# Patient Record
Sex: Male | Born: 1995 | Race: White | Hispanic: No | Marital: Single | Smoking: Never smoker
Health system: Southern US, Community
[De-identification: ages and names within clinical notes are randomized; demographics above are authoritative.]

---
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
## 2014-03-26 ENCOUNTER — Other Ambulatory Visit: Payer: Self-pay | Admitting: Gastroenterology

## 2014-03-26 DIAGNOSIS — IMO0001 Reserved for inherently not codable concepts without codable children: Secondary | ICD-10-CM

## 2014-03-26 DIAGNOSIS — K219 Gastro-esophageal reflux disease without esophagitis: Principal | ICD-10-CM

## 2014-03-29 ENCOUNTER — Ambulatory Visit
Admission: RE | Admit: 2014-03-29 | Discharge: 2014-03-29 | Disposition: A | Discharge status: Home / Self Care | Payer: BLUE CROSS/BLUE SHIELD | Source: Ambulatory Visit | Attending: Gastroenterology | Admitting: Gastroenterology

## 2014-03-29 DIAGNOSIS — IMO0001 Reserved for inherently not codable concepts without codable children: Secondary | ICD-10-CM

## 2014-03-29 DIAGNOSIS — K219 Gastro-esophageal reflux disease without esophagitis: Principal | ICD-10-CM

## 2014-04-23 ENCOUNTER — Other Ambulatory Visit (INDEPENDENT_AMBULATORY_CARE_PROVIDER_SITE_OTHER): Payer: Self-pay | Admitting: Surgery

## 2014-04-23 NOTE — H&P (Signed)
Gordon Coleman 04/23/2014 4:15 PM Location: [facility] Patient #: [id number] DOB: 05-Jun-1995 Single / Language: [place] / Race: White Male History of Present Illness Gordon Coleman(Gordon Masih C. Jilliann Subramanian MD; 04/23/2014 6:04 PM) Patient words: evaluate possible fistula vs fissure.  The patient is a 19 year old male who presents with an anal fissure. Patient sent by gastroenterologist Dr. Madilyn FiremanHayes for concern of anal fistula versus fissure. 19 [place] with chronic perianal pain and drainage for several years. His had recurrent flares of pain and drainage. Researched on Internet. He wondered if he had an anal fistula. Evaluated [facility] student [facility] center. Sent to gastroenterology. Concern of fissure versus fistula. Surgical consultation requested. Mother comes along as well. Patient struggles with moderate heartburn and reflux with known hiatal hernia by upper GI. Was using H2-blocker. Recently increased on proton pump inhibitor. It seems to be a little better controlled now. Over the persistent rectal drainage and bleeding has been annoying. No history of inflammatory bowel disease. No Crohn's. No ulcerative colitis. No irritable bowel syndrome. No sick contacts or travel history. No history of fall or trauma. No other infections. Other Problems Gordon Coleman(Gordon [place]; 04/23/2014 4:16 PM) Anxiety Disorder Gastroesophageal Reflux Disease  Past Surgical History Gordon Coleman(Gordon [place]; 04/23/2014 4:16 PM) No pertinent past surgical history  Diagnostic Studies History Gordon Coleman(Gordon [place], [place]; 04/23/2014 4:16 PM) Colonoscopy never  Allergies Gordon Coleman(Gordon [place], [place]; 04/23/2014 4:17 PM) No Known Drug Allergies 04/23/2014  Medication History Gordon Coleman(Gordon [place], [place]; 04/23/2014 4:18 PM) BuPROPion HCl ER (SR) (150MG  Tablet ER 12HR, Oral) Active. Qvar (80MCG/ACT Aerosol Soln, Inhalation) Active. Symbicort (160-4.5MCG/ACT Aerosol, Inhalation) Active. TraMADol HCl (50MG  Tablet, Oral)  Active. Zantac (150MG  Tablet, Oral) Active.  Social History Gordon Coleman(Gordon [place]; 04/23/2014 4:16 PM) Caffeine use Carbonated beverages. No alcohol use No drug use Tobacco use Never smoker.  Family History Gordon Coleman(Gordon [place], [place]; 04/23/2014 4:16 PM) Breast Cancer Family Members In General. Heart Disease Mother. Heart disease in male family member before age 20 Heart disease in male family member before age 20 Respiratory Condition Father.     Review of Systems Gordon Coleman(Gordon [place]; 04/23/2014 4:16 PM) General Not Present- Appetite Loss, Chills, Fatigue, Fever, Night Sweats, Weight Gain and Weight Loss. Skin Not Present- Change in Wart/Mole, Dryness, Hives, Jaundice, New Lesions, Non-Healing Wounds, Rash and Ulcer. HEENT Not Present- Earache, Hearing Loss, Hoarseness, Nose Bleed, Oral Ulcers, Ringing in the Ears, Seasonal Allergies, Sinus Pain, Sore Throat, Visual Disturbances, Wears glasses/contact lenses and Yellow Eyes. Respiratory Present- Chronic Cough. Not Present- Bloody sputum, Difficulty Breathing, Snoring and Wheezing. Breast Not Present- Breast Mass, Breast Pain, Nipple Discharge and Skin Changes. Cardiovascular Not Present- Chest Pain, Difficulty Breathing Lying Down, Leg Cramps, Palpitations, Rapid Heart Rate, Shortness of Breath and Swelling of Extremities. Gastrointestinal Present- Bloody Stool and Indigestion. Not Present- Abdominal Pain, Bloating, Change in Bowel Habits, Chronic diarrhea, Constipation, Difficulty Swallowing, Excessive gas, Gets full quickly at meals, Hemorrhoids, Nausea, Rectal Pain and Vomiting. Male Genitourinary Not Present- Blood in Urine, Change in Urinary Stream, Frequency, Impotence, Nocturia, Painful Urination, Urgency and Urine Leakage.  Vitals Gordon Coleman(Gordon [place] MA; 04/23/2014 4:16 PM) 04/23/2014 4:16 PM Weight: 225.2 lb Height: 67in Body Surface Area: 2.2 m Body Mass Index: 35.27 kg/m Temp.: 97.56F(Temporal)  Pulse: 80 (Regular)  Resp.:  18 (Unlabored)  BP: 120/68 (Sitting, Left Arm, Standard)     Physical Exam Gordon Coleman(Shrihaan Porzio C. Gianpaolo Mindel MD; 04/23/2014 4:38 PM)  General Mental Status-Alert. General Appearance-Not in acute distress, Not Sickly. Orientation-Oriented X3. Hydration-Well hydrated. Voice-Normal.  Integumentary  Global Assessment Upon inspection and palpation of skin surfaces of the - Axillae: non-tender, no inflammation or ulceration, no drainage. and Distribution of scalp and body hair is normal. General Characteristics Temperature - normal warmth is noted.  Head and Neck Head-normocephalic, atraumatic with no lesions or palpable masses. Face Global Assessment - atraumatic, no absence of expression. Neck Global Assessment - no abnormal movements, no bruit auscultated on the right, no bruit auscultated on the left, no decreased range of motion, non-tender. Trachea-midline. Thyroid Gland Characteristics - non-tender.  Eye Eyeball - Left-Extraocular movements intact, No Nystagmus. Eyeball - Right-Extraocular movements intact, No Nystagmus. Cornea - Left-No Hazy. Cornea - Right-No Hazy. Sclera/Conjunctiva - Left-No scleral icterus, No Discharge. Sclera/Conjunctiva - Right-No scleral icterus, No Discharge. Pupil - Left-Direct reaction to light normal. Pupil - Right-Direct reaction to light normal.  ENMT Ears Pinna - Left - no drainage observed, no generalized tenderness observed. Right - no drainage observed, no generalized tenderness observed. Nose and Sinuses External Inspection of the Nose - no destructive lesion observed. Inspection of the nares - Left - quiet respiration. Right - quiet respiration. Mouth and Throat Lips - Upper Lip - no fissures observed, no pallor noted. Lower Lip - no fissures observed, no pallor noted. Nasopharynx - no discharge present. Oral Cavity/Oropharynx - Tongue - no dryness observed. Oral Mucosa - no cyanosis observed. Hypopharynx - no evidence  of airway distress observed.  Chest and Lung Exam Inspection Movements - Normal and Symmetrical. Accessory muscles - No use of accessory muscles in breathing. Palpation Palpation of the chest reveals - Non-tender. Auscultation Breath sounds - Normal and Clear.  Cardiovascular Auscultation Rhythm - Regular. Murmurs & Other Heart Sounds - Auscultation of the heart reveals - No Murmurs and No Systolic Clicks.  Abdomen Inspection Inspection of the abdomen reveals - No Visible peristalsis and No Abnormal pulsations. Umbilicus - No Bleeding, No Urine drainage. Palpation/Percussion Palpation and Percussion of the abdomen reveal - Soft, Non Tender, No Rebound tenderness, No Rigidity (guarding) and No Cutaneous hyperesthesia.  Male Genitourinary Sexual Maturity Tanner 5 - Adult hair pattern and Adult penile size and shape.  Rectal Note: Perianal skin clean. Chronic posterior midline opening 4 cm from anal verge. Fibrous cord heading more proximally into the posterior anus. Mild maceration as well. No definite posterior midline fissure. Mild increased sphincter tone. Anoscopy and rectal exam held off.   Peripheral Vascular Upper Extremity Inspection - Left - No Cyanotic nailbeds, Not Ischemic. Right - No Cyanotic nailbeds, Not Ischemic.  Neurologic Neurologic evaluation reveals -normal attention span and ability to concentrate, able to name objects and repeat phrases. Appropriate fund of knowledge , normal sensation and normal coordination. Mental Status Affect - not angry, not paranoid. Cranial Nerves-Normal Bilaterally. Gait-Normal.  Neuropsychiatric Mental status exam performed with findings of-able to articulate well with normal speech/language, rate, volume and coherence, thought content normal with ability to perform basic computations and apply abstract reasoning and no evidence of hallucinations, delusions, obsessions or homicidal/suicidal  ideation.  Musculoskeletal Global Assessment Spine, Ribs and Pelvis - no instability, subluxation or laxity. Right Upper Extremity - no instability, subluxation or laxity.  Lymphatic Head & Neck  General Head & Neck Lymphatics: Bilateral - Description - No Localized lymphadenopathy. Axillary  General Axillary Region: Bilateral - Description - No Localized lymphadenopathy. Femoral & Inguinal  Generalized Femoral & Inguinal Lymphatics: Left - Description - No Localized lymphadenopathy. Right - Description - No Localized lymphadenopathy.    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 Would benefit from examination under anesthesia. Excision and fistulotomy if superficial. LIFT repair if it is deeper / intersphincteric. Doubt it will need a seton. He is wishing to plan this surgery over a time when he will miss school the least. Hopefully will not worsen by the summertime. Sooner if recurrent flares. His mother wished sooner.  Current Plans Schedule for Surgery Pt Education - [facility] Abscess/Fistula (AT) Pt Education - [facility] Rectal Surgery HCI (Doree Kuehne): discussed with patient and provided information. Pt Education - [facility] Good Bowel Health (Destin Kittler) Pt Education - [facility] Pain Control (Jendayi Berling) Pt Education - [facility] Pelvic Floor Exercises (Kegels) and Dysfunction HCI (Anaijah Augsburger)  Gordon Coleman, M.D., F.A.C.S. Gastrointestinal and Minimally Invasive Surgery [facility] [address], [address] [place], [place] [address] [phone] Main / Paging

## 2015-12-12 IMAGING — RF DG UGI W/ HIGH DENSITY W/KUB
18 of 24 series · 18 of 24 positions shown · non-contrast
Comparison: None.

CLINICAL DATA: Gastroesophageal reflux

EXAM:
UPPER GI SERIES WITH KUB
TECHNIQUE: After obtaining a scout radiograph a routine upper GI series was
performed using thin and high density barium.
FLUOROSCOPY TIME:  2 min and 36 seconds

[Series 1: run · 1 of 1 slices shown (1 of 17)]
[im 1/1]
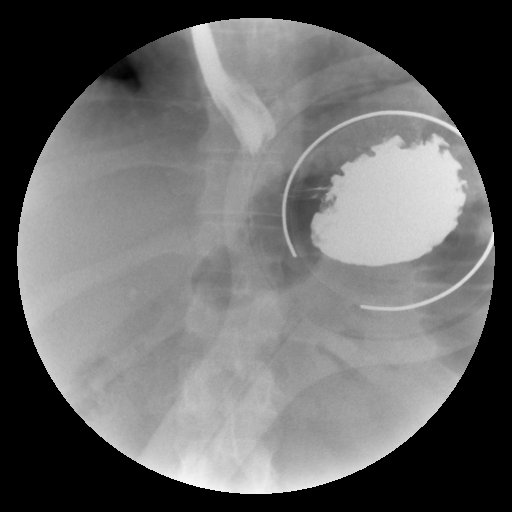

[Series 3: run · 1 of 1 slices shown (2 of 17)]
[im 1/1]
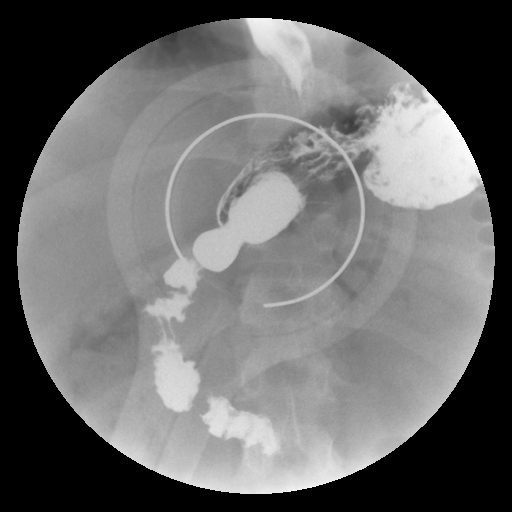

[Series 4: run · 1 of 1 slices shown (3 of 17)]
[im 1/1]
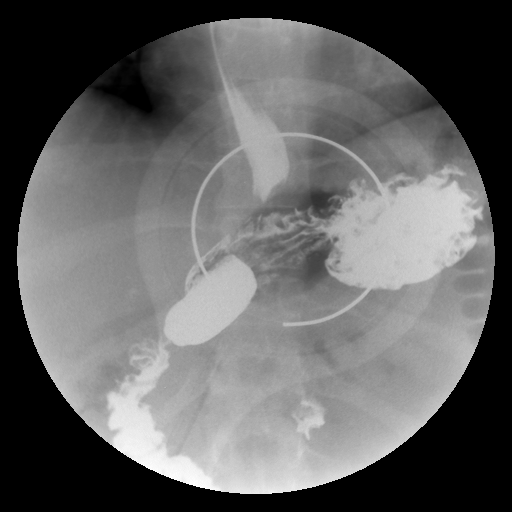

[Series 5: run · 1 of 1 slices shown (4 of 17)]
[im 1/1]
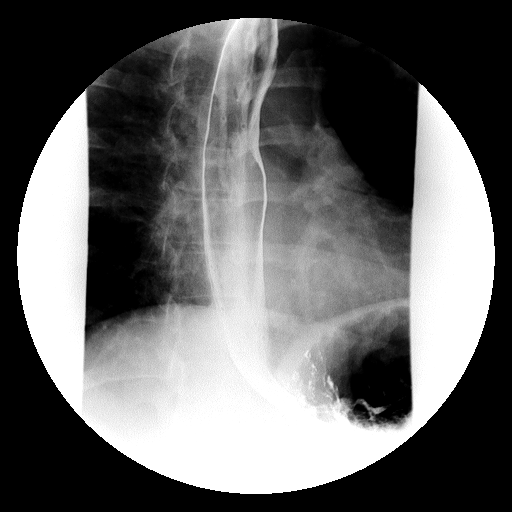

[Series 7: run · 1 of 1 slices shown (5 of 17)]
[im 1/1]
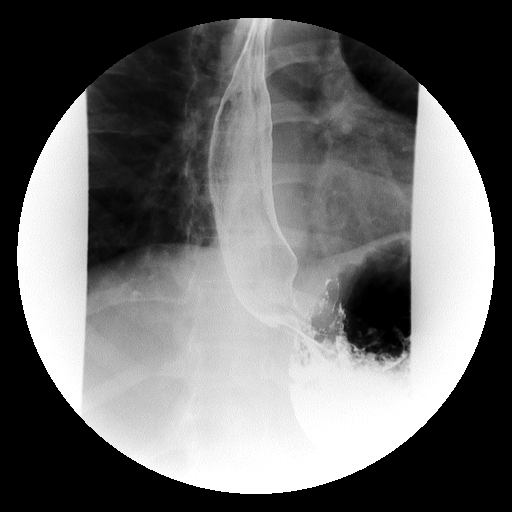

[Series 8: run · 1 of 1 slices shown (6 of 17)]
[im 1/1]
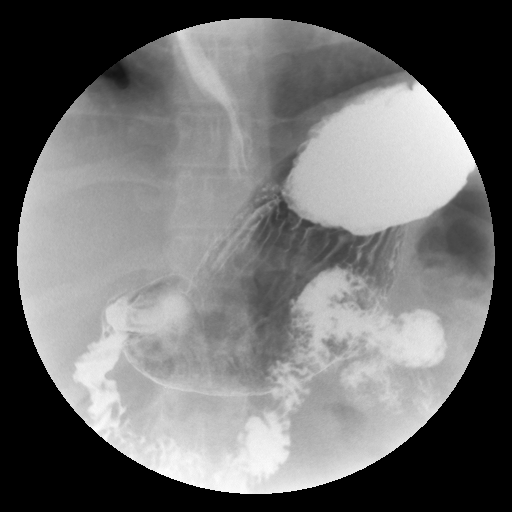

[Series 9: run · 1 of 1 slices shown (7 of 17)]
[im 1/1]
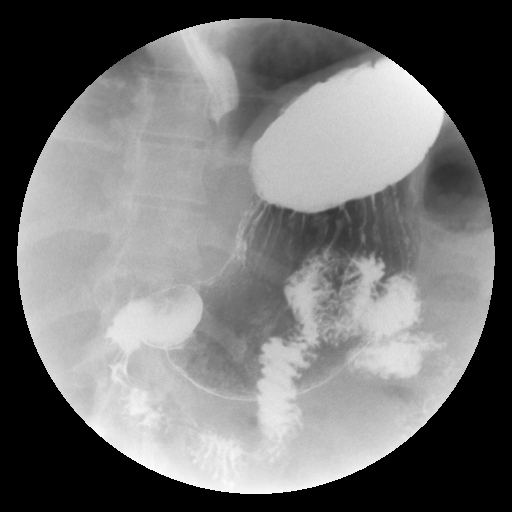

[Series 11: run · 1 of 1 slices shown (8 of 17)]
[im 1/1]
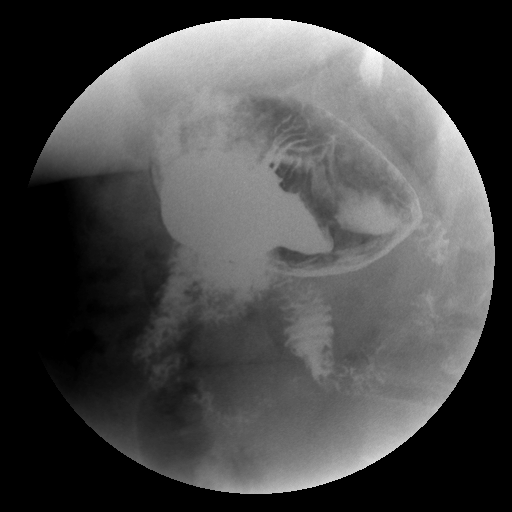

[Series 12: run · 1 of 1 slices shown (9 of 17)]
[im 1/1]
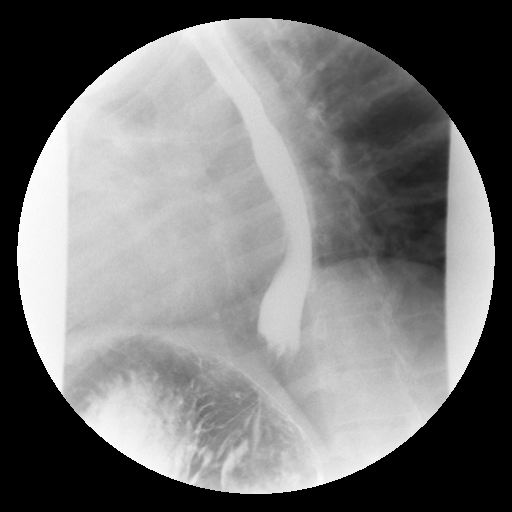

[Series 13: run · 1 of 1 slices shown (10 of 17)]
[im 1/1]
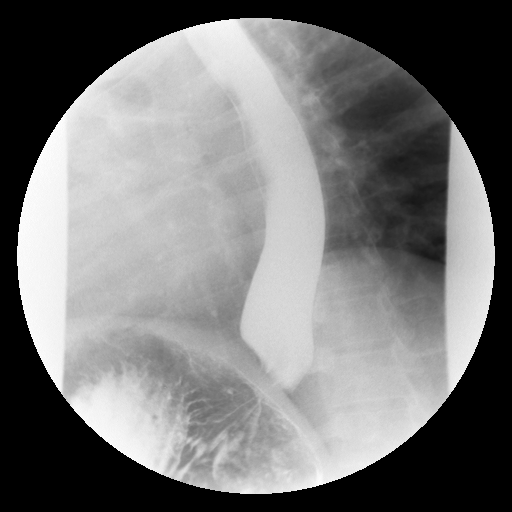

[Series 15: run · 1 of 1 slices shown (11 of 17)]
[im 1/1]
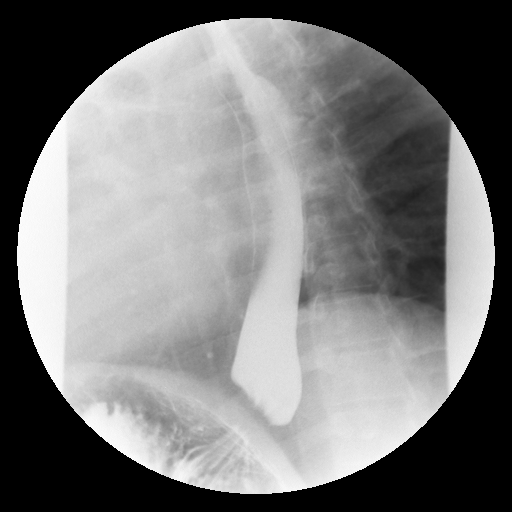

[Series 16: run · 1 of 1 slices shown (12 of 17)]
[im 1/1]
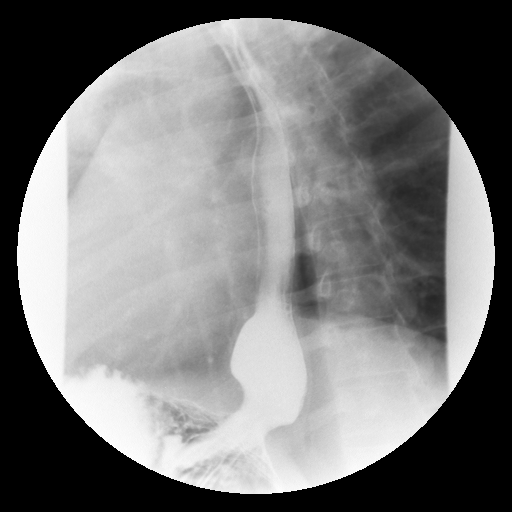

[Series 17: run · 1 of 1 slices shown (13 of 17)]
[im 1/1]
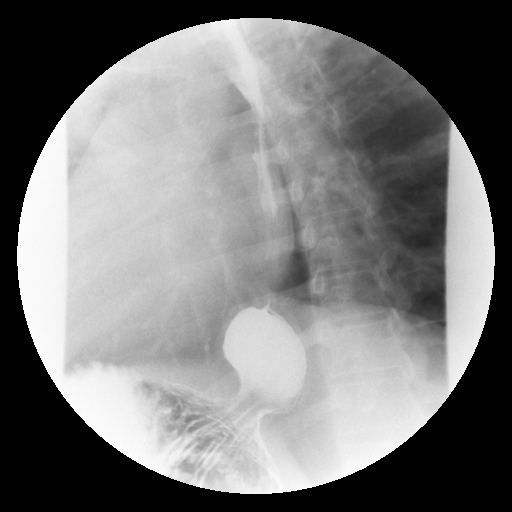

[Series 19: run · 1 of 1 slices shown (14 of 17)]
[im 1/1]
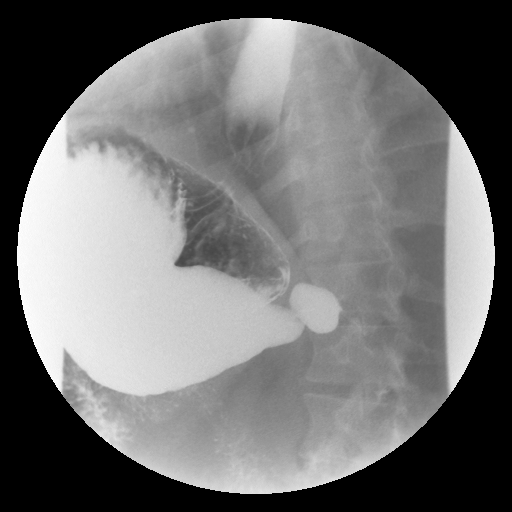

[Series 20: run · 1 of 1 slices shown (15 of 17)]
[im 1/1]
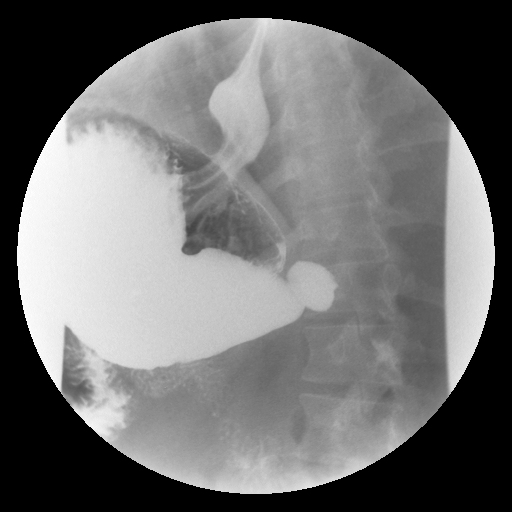

[Series 21: run · 1 of 1 slices shown (16 of 17)]
[im 1/1]
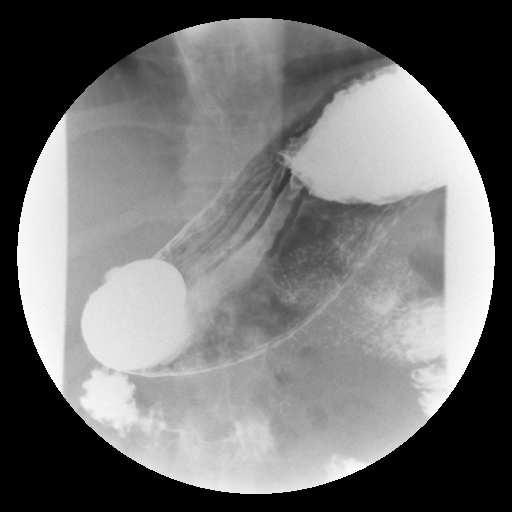

[Series 23: run · 1 of 1 slices shown (17 of 17)]
[im 1/1]
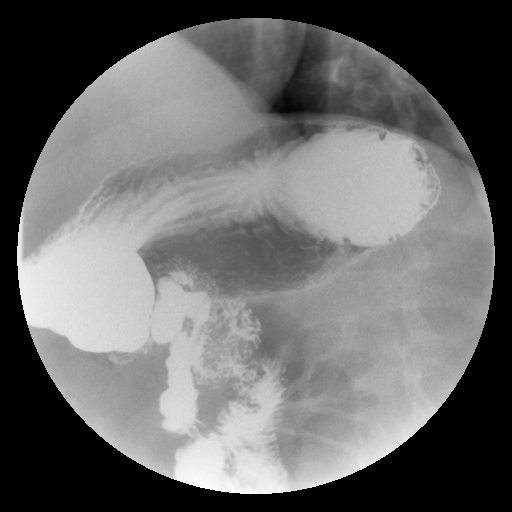

[Series 1001: view not recorded · 0.20mm/px · 1 of 1 slices shown]
[im 1/1]
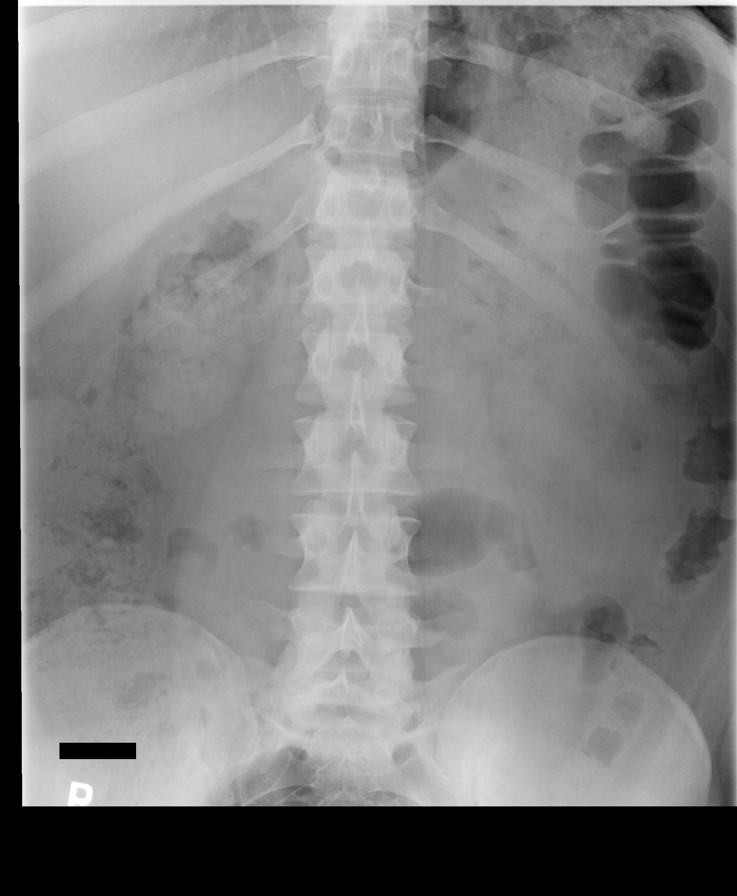

[18 of 24 positions shown; findings below may reference images not displayed]

FINDINGS: Initial barium swallows demonstrate normal esophageal motility. No
intrinsic or extrinsic lesions of the esophagus are identified and
no mucosal abnormalities are seen. There is a small sliding-type
hiatal hernia and air were multiple episodes of spontaneous GE
reflux.

The stomach, duodenum bulb and C-loop are normal. Normal mucosal
fold pattern.
IMPRESSION: Small sliding-type hiatal hernia and episodes of spontaneous GE
reflux.

Normal appearance of the stomach and duodenum.

## 2016-08-27 MED FILL — OMEPRAZOLE DR 40 MG CAPSULE: 40 | 30 days supply | Qty: 60 | Fill #0

## 2016-09-30 MED FILL — OMEPRAZOLE DR 40 MG CAPSULE: 40 | 90 days supply | Qty: 180 | Fill #1

## 2016-12-02 DIAGNOSIS — F338 Other recurrent depressive disorders: Secondary | ICD-10-CM | POA: Diagnosis not present

## 2016-12-02 DIAGNOSIS — R41840 Attention and concentration deficit: Secondary | ICD-10-CM | POA: Diagnosis not present

## 2016-12-02 DIAGNOSIS — F4010 Social phobia, unspecified: Secondary | ICD-10-CM | POA: Diagnosis not present

## 2016-12-02 DIAGNOSIS — F419 Anxiety disorder, unspecified: Secondary | ICD-10-CM | POA: Diagnosis not present

## 2016-12-02 MED FILL — ADDERALL XR 20 MG CAP SA: 20 | 30 days supply | Qty: 30 | Fill #0

## 2017-01-05 DIAGNOSIS — R41840 Attention and concentration deficit: Secondary | ICD-10-CM | POA: Diagnosis not present

## 2017-01-05 DIAGNOSIS — F338 Other recurrent depressive disorders: Secondary | ICD-10-CM | POA: Diagnosis not present

## 2017-01-05 DIAGNOSIS — F902 Attention-deficit hyperactivity disorder, combined type: Secondary | ICD-10-CM | POA: Diagnosis not present

## 2017-01-05 DIAGNOSIS — F4010 Social phobia, unspecified: Secondary | ICD-10-CM | POA: Diagnosis not present

## 2017-01-05 DIAGNOSIS — Z79899 Other long term (current) drug therapy: Secondary | ICD-10-CM | POA: Diagnosis not present

## 2017-01-05 DIAGNOSIS — F419 Anxiety disorder, unspecified: Secondary | ICD-10-CM | POA: Diagnosis not present

## 2017-01-11 MED FILL — OMEPRAZOLE DR 40 MG CAPSULE: 40 | 30 days supply | Qty: 60 | Fill #0

## 2017-02-21 MED FILL — OMEPRAZOLE DR 40 MG CAPSULE: 40 | 30 days supply | Qty: 60 | Fill #1

## 2017-03-29 MED FILL — OMEPRAZOLE DR 40 MG CAPSULE: 40 | 30 days supply | Qty: 60 | Fill #2

## 2017-03-29 MED FILL — BUPROPION HCL XL 300 MG TAB: 300 | 30 days supply | Qty: 30 | Fill #0

## 2017-04-04 DIAGNOSIS — F902 Attention-deficit hyperactivity disorder, combined type: Secondary | ICD-10-CM | POA: Diagnosis not present

## 2017-04-04 DIAGNOSIS — Z79899 Other long term (current) drug therapy: Secondary | ICD-10-CM | POA: Diagnosis not present

## 2017-04-04 DIAGNOSIS — F419 Anxiety disorder, unspecified: Secondary | ICD-10-CM | POA: Diagnosis not present

## 2017-04-04 MED FILL — guanFACINE HCL ER 1 MG TB24: 1 | 30 days supply | Qty: 30 | Fill #0

## 2017-04-09 DIAGNOSIS — F902 Attention-deficit hyperactivity disorder, combined type: Secondary | ICD-10-CM | POA: Diagnosis not present

## 2017-04-09 DIAGNOSIS — F419 Anxiety disorder, unspecified: Secondary | ICD-10-CM | POA: Diagnosis not present

## 2017-04-09 DIAGNOSIS — Z6829 Body mass index (BMI) 29.0-29.9, adult: Secondary | ICD-10-CM | POA: Diagnosis not present

## 2017-04-09 DIAGNOSIS — F329 Major depressive disorder, single episode, unspecified: Secondary | ICD-10-CM | POA: Diagnosis not present

## 2017-04-09 DIAGNOSIS — J45909 Unspecified asthma, uncomplicated: Secondary | ICD-10-CM | POA: Diagnosis not present

## 2017-04-09 DIAGNOSIS — Z79899 Other long term (current) drug therapy: Secondary | ICD-10-CM | POA: Diagnosis not present

## 2017-04-09 DIAGNOSIS — K219 Gastro-esophageal reflux disease without esophagitis: Secondary | ICD-10-CM | POA: Diagnosis not present

## 2017-04-11 MED FILL — METHYLPHENIDATE ER 50 MG CA: 50 | 30 days supply | Qty: 30 | Fill #0

## 2017-04-29 MED FILL — BUPROPION HCL XL 300 MG TAB: 300 | 30 days supply | Qty: 30 | Fill #1

## 2017-04-29 MED FILL — OMEPRAZOLE DR 40 MG CAPSULE: 40 | 30 days supply | Qty: 60 | Fill #3

## 2017-04-29 MED FILL — guanFACINE HCL ER 1 MG TB24: 1 | 30 days supply | Qty: 30 | Fill #1

## 2017-05-04 MED FILL — VYVANSE 10 MG CAPSULE: 10 | 30 days supply | Qty: 30 | Fill #0

## 2017-05-23 MED FILL — VYVANSE 60 MG CAPSULE: 60 | 30 days supply | Qty: 30 | Fill #0

## 2017-05-23 MED FILL — DEXTROAMP-AMP 10 MG TAB: 10 | 30 days supply | Qty: 30 | Fill #0

## 2017-06-08 MED FILL — BUPROPION HCL XL 300 MG TAB: 300 | 30 days supply | Qty: 30 | Fill #2

## 2017-06-09 MED FILL — guanFACINE HCL ER 1 MG TB24: 1 | 30 days supply | Qty: 30 | Fill #0

## 2017-06-22 ENCOUNTER — Telehealth: Payer: 59 | Admitting: Family

## 2017-06-22 DIAGNOSIS — J208 Acute bronchitis due to other specified organisms: Secondary | ICD-10-CM | POA: Diagnosis not present

## 2017-06-22 DIAGNOSIS — B9689 Other specified bacterial agents as the cause of diseases classified elsewhere: Secondary | ICD-10-CM

## 2017-06-22 DIAGNOSIS — R05 Cough: Secondary | ICD-10-CM

## 2017-06-22 DIAGNOSIS — R059 Cough, unspecified: Secondary | ICD-10-CM

## 2017-06-22 MED ORDER — AZITHROMYCIN 250 MG PO TABS: ORAL_TABLET | ORAL | 0 refills | Status: DC

## 2017-06-22 MED ORDER — PREDNISONE 10 MG (21) PO TBPK: ORAL_TABLET | ORAL | 0 refills | Status: DC

## 2017-06-22 NOTE — Progress Notes (Signed)
We are sorry that you are not feeling well.  Here is how we plan to help!  Based on your presentation I believe you most likely have A cough due to bacteria.  When patients have a fever and a productive cough with a change in color or increased sputum production, we are concerned about bacterial bronchitis.  If left untreated it can progress to pneumonia.  If your symptoms do not improve with your treatment plan it is important that you contact your provider.   I have prescribed Azithromyin 250 mg: two tablets now and then one tablet daily for 4 additonal days    In addition you may use A non-prescription cough medication called Robitussin DAC. Take 2 teaspoons every 8 hours or Delsym: take 2 teaspoons every 12 hours. and A non-prescription cough medication called Mucinex DM: take 2 tablets every 12 hours.  Sterapred 10 mg dosepak, I am sorry we can not prescribe controlled cough syrups through an Evisit.  From your responses in the eVisit questionnaire you describe inflammation in the upper respiratory tract which is causing a significant cough.  This is commonly called Bronchitis and has four common causes:    Allerg  Viral Infections  Acid Reflux  Bacterial Infection Allergies, viruses and acid reflux are treated by controlling symptoms or eliminating the cause. An example might be a cough caused by taking certain blood pressure medications. You stop the cough by changing the medication. Another example might be a cough caused by acid reflux. Controlling the reflux helps control the cough.  USE OF BRONCHODILATOR ("RESCUE") INHALERS: There is a risk from using your bronchodilator too frequently.  The risk is that over-reliance on a medication which only relaxes the muscles surrounding the breathing tubes can reduce the effectiveness of medications prescribed to reduce swelling and congestion of the tubes themselves.  Although you feel brief relief from the bronchodilator inhaler, your asthma  may actually be worsening with the tubes becoming more swollen and filled with mucus.  This can delay other crucial treatments, such as oral steroid medications. If you need to use a bronchodilator inhaler daily, several times per day, you should discuss this with your provider.  There are probably better treatments that could be used to keep your asthma under control.     HOME CARE . Only take medications as instructed by your medical team. . Complete the entire course of an antibiotic. . Drink plenty of fluids and get plenty of rest. . Avoid close contacts especially the very young and the elderly . Cover your mouth if you cough or cough into your sleeve. . Always remember to wash your hands . A steam or ultrasonic humidifier can help congestion.   GET HELP RIGHT AWAY IF: . You develop worsening fever. . You become short of breath . You cough up blood. . Your symptoms persist after you have completed your treatment plan MAKE SURE YOU   Understand these instructions.  Will watch your condition.  Will get help right away if you are not doing well or get worse.  Your e-visit answers were reviewed by a board certified advanced clinical practitioner to complete your personal care plan.  Depending on the condition, your plan could have included both over the counter or prescription medications. If there is a problem please reply  once you have received a response from your provider. Your safety is important to us.  If you have drug allergies check your prescription carefully.    You can use  MyChart to ask questions about today's visit, request a non-urgent call back, or ask for a work or school excuse for 24 hours related to this e-Visit. If it has been greater than 24 hours you will need to follow up with your provider, or enter a new e-Visit to address those concerns. You will get an e-mail in the next two days asking about your experience.  I hope that your e-visit has been valuable and  will speed your recovery. Thank you for using e-visits.

## 2017-06-23 MED FILL — VYVANSE 60 MG CAPSULE: 60 | 30 days supply | Qty: 30 | Fill #0

## 2017-06-24 ENCOUNTER — Telehealth: Payer: 59 | Admitting: Nurse Practitioner

## 2017-06-24 DIAGNOSIS — K219 Gastro-esophageal reflux disease without esophagitis: Secondary | ICD-10-CM | POA: Diagnosis not present

## 2017-06-24 DIAGNOSIS — Z79899 Other long term (current) drug therapy: Secondary | ICD-10-CM | POA: Diagnosis not present

## 2017-06-24 DIAGNOSIS — F902 Attention-deficit hyperactivity disorder, combined type: Secondary | ICD-10-CM | POA: Diagnosis not present

## 2017-06-24 DIAGNOSIS — F419 Anxiety disorder, unspecified: Secondary | ICD-10-CM | POA: Diagnosis not present

## 2017-06-24 MED ORDER — ESOMEPRAZOLE MAGNESIUM 40 MG PO CPDR: 40.0000 mg | DELAYED_RELEASE_CAPSULE | Freq: Every day | ORAL | 0 refills | Status: AC

## 2017-06-24 MED FILL — ATOMOXETINE HCL 25 MG CAP: 25 | 30 days supply | Qty: 60 | Fill #0

## 2017-06-24 MED FILL — buPROPion HCL ER (XL) 150 M: 150 | 30 days supply | Qty: 30 | Fill #0

## 2017-06-24 NOTE — Progress Notes (Signed)
We are sorry that you are not feeling well.  Here is how we plan to help!  Based on what you shared with me it looks like you most likely have Gastroesophageal Reflux Disease (GERD)  Gastroesophageal reflux disease (GERD) happens when acid from your stomach flows up into the esophagus.  When acid comes in contact with the esophagus, the acid causes sorenss (inflammation) in the esophagus.  Over time, GERD may create small holes (ulcers) in the lining of the esophagus.  I have prescribed nexium, a Protein Pump inhibitor, 20 mg daily until you follow up with a provider.  Your symptoms should improve in the next day or two.  You can use antacids as needed until symptoms resolve.  Call us if your heartburn worsens, you have trouble swallowing, weight loss, spitting up blood or recurrent vomiting.  Home Care:  May include lifestyle changes such as weight loss, quitting smoking and alcohol consumption  Avoid foods and drinks that make your symptoms worse, such as:  Caffeine or alcoholic drinks  Chocolate  Peppermint or mint flavorings  Garlic and onions  Spicy foods  Citrus fruits, such as oranges, lemons, or limes  Tomato-based foods such as sauce, chili, salsa and pizza  Fried and fatty foods  Avoid lying down for 3 hours prior to your bedtime or prior to taking a nap  Eat small, frequent meals instead of a large meals  Wear loose-fitting clothing.  Do not wear anything tight around your waist that causes pressure on your stomach.  Raise the head of your bed 6 to 8 inches with wood blocks to help you sleep.  Extra pillows will not help.  Seek Help Right Away If:  You have pain in your arms, neck, jaw, teeth or back  Your pain increases or changes in intensity or duration  You develop nausea, vomiting or sweating (diaphoresis)  You develop shortness of breath or you faint  Your vomit is green, yellow, black or looks like coffee grounds or blood  Your stool is red,  bloody or black  These symptoms could be signs of other problems, such as heart disease, gastric bleeding or esophageal bleeding.  Make sure you :  Understand these instructions.  Will watch your condition.  Will get help right away if you are not doing well or get worse.  Your e-visit answers were reviewed by a board certified advanced clinical practitioner to complete your personal care plan.  Depending on the condition, your plan could have included both over the counter or prescription medications.  If there is a problem please reply  once you have received a response from your provider.  Your safety is important to us.  If you have drug allergies check your prescription carefully.    You can use MyChart to ask questions about today's visit, request a non-urgent call back, or ask for a work or school excuse for 24 hours related to this e-Visit. If it has been greater than 24 hours you will need to follow up with your provider, or enter a new e-Visit to address those concerns.  You will get an e-mail in the next two days asking about your experience.  I hope that your e-visit has been valuable and will speed your recovery. Thank you for using e-visits.

## 2017-07-18 ENCOUNTER — Ambulatory Visit (INDEPENDENT_AMBULATORY_CARE_PROVIDER_SITE_OTHER): Payer: Self-pay | Admitting: Family Medicine

## 2017-07-18 DIAGNOSIS — Z111 Encounter for screening for respiratory tuberculosis: Secondary | ICD-10-CM

## 2017-07-18 NOTE — Patient Instructions (Signed)
Tuberculin Skin Test  Why am I having this test?  Tuberculosis (TB) is a bacterial infection caused by Mycobacterium tuberculosis. Most people who are exposed to these bacteria have a strong enough defense (immune) system to prevent the bacteria from causing TB and developing symptoms. Their bodies prevent the germs from being active and making them sick (latent TB infection).  However, if you have TB germs in your body and your immune system is weak, you can develop a TB infection. This can cause symptoms such as:  · Night sweats.  · Fever.  · Weakness.  · Weight loss.    A latent TB infection can also become active later in life if your immune system becomes weakened or compromised.  You may have this test if your health care provider suspects that you have TB. You may also have this test to screen for TB if you are at risk for getting the disease. Those at increased risk include:  · People who inject illegal drugs or share needles.  · People with HIV or other diseases that affect immunity.  · Health care workers.  · People who live in high-risk communities, such as homeless shelters, nursing homes, and correctional facilities.  · People who have been in contact with someone with TB.  · People from countries where TB is more common.    If you are in a high-risk group, your health care provider may wish to screen for TB more often. This can help prevent the spread of the disease. Sometimes TB screening is required when starting a new job, such as becoming a health care worker or a teacher. Colleges or universities may require it of new students.  What is being tested?  A tuberculin skin test is the main test used to check for exposure to the bacteria that can cause TB. The test checks for antibodies to the bacteria. Antibodies are proteins that your body produces to protect you from germs and other things that can make you sick.  Your health care provider will inject a solution known as PPD (purified  protein derivative) under the first layer of skin on your arm. This causes a blister-like bubble to form at the site. Your health care provider will then examine the site after a number of hours have passed to see if a reaction has occurred.  How do I prepare for this test?  There is no preparation required for this test.  What do the results mean?  Your test results will be reported as either negative or positive.  If the tuberculin skin test produces a negative result, it is likely that you do not have TB and have not been exposed to the TB bacteria.  If you or your health care provider suspects exposure, however, you may want to repeat the test a few weeks later. A blood test may also be used to check for TB. This is because you will not react to the tuberculin skin test until several weeks after exposure to TB bacteria.  If you test positive to the tuberculin skin test, it is likely that you have been exposed to TB bacteria. The test does not distinguish between an active and a latent TB infection.  A false-positive result can occur. A false-positive result for TB bacteria is incorrect because it indicates a condition or finding is present when it is not.  Talk to your health care provider to discuss your results, treatment options, and if necessary, the need for more tests.    It is your responsibility to obtain your test results. Ask the lab or department performing the test when and how you will get your results. Talk with your health care provider if you have any questions about your results.  Talk with your health care provider to discuss your results, treatment options, and if necessary, the need for more tests. Talk with your health care provider if you have any questions about your results.  This information is not intended to replace advice given to you by your health care provider. Make sure you discuss any questions you have with your health care provider.   Document Released: 12/09/2004 Document Revised: 11/02/2015 Document Reviewed: 06/25/2013  Elsevier Interactive Patient Education © 2018 [employer]

## 2017-07-19 MED FILL — ATOMOXETINE HCL 80 MG CAPS: 80 | 30 days supply | Qty: 30 | Fill #0

## 2017-07-19 NOTE — Progress Notes (Signed)
PPD placed by C[facility] [place]- see administration notes- patient advised to f/u in 48-72 hours for read.

## 2017-07-20 LAB — TB SKIN TEST
Induration: NEGATIVE mm
TB SKIN TEST: NEGATIVE

## 2017-07-23 ENCOUNTER — Telehealth: Payer: 59 | Admitting: Family

## 2017-07-23 DIAGNOSIS — R112 Nausea with vomiting, unspecified: Secondary | ICD-10-CM

## 2017-07-23 MED ORDER — ONDANSETRON 4 MG PO TBDP: 4.0000 mg | ORAL_TABLET | Freq: Three times a day (TID) | ORAL | 0 refills | Status: DC | PRN

## 2017-07-23 NOTE — Progress Notes (Signed)
Thank you for the details you included in the comment boxes. Those details are very helpful in determining the best course of treatment for you and help us to provide the best care.  We are sorry that you are not feeling well. Here is how we plan to help!  Based on what you have shared with me it looks like you have a Virus that is irritating your GI tract.  Vomiting is the forceful emptying of a portion of the stomach's content through the mouth.  Although nausea and vomiting can make you feel miserable, it's important to remember that these are not diseases, but rather symptoms of an underlying illness.  When we treat short term symptoms, we always caution that any symptoms that persist should be fully evaluated in a medical office.  I have prescribed a medication that will help alleviate your symptoms and allow you to stay hydrated:  Zofran 4 mg 1 tablet every 8 hours as needed for nausea and vomiting  HOME CARE:  Drink clear liquids.  This is very important! Dehydration (the lack of fluid) can lead to a serious complication.  Start off with 1 tablespoon every 5 minutes for 8 hours.  You may begin eating bland foods after 8 hours without vomiting.  Start with saltine crackers, white bread, rice, mashed potatoes, applesauce.  After 48 hours on a bland diet, you may resume a normal diet.  Try to go to sleep.  Sleep often empties the stomach and relieves the need to vomit.  GET HELP RIGHT AWAY IF:   Your symptoms do not improve or worsen within 2 days after treatment.  You have a fever for over 3 days.  You cannot keep down fluids after trying the medication.  MAKE SURE YOU:   Understand these instructions.  Will watch your condition.  Will get help right away if you are not doing well or get worse.   Thank you for choosing an e-visit. Your e-visit answers were reviewed by a board certified advanced clinical practitioner to complete your personal care plan. Depending upon the  condition, your plan could have included both over the counter or prescription medications. Please review your pharmacy choice. Be sure that the pharmacy you have chosen is open so that you can pick up your prescription now.  If there is a problem you may message your provider in MyChart to have the prescription routed to another pharmacy. Your safety is important to us. If you have drug allergies check your prescription carefully.  For the next 24 hours, you can use MyChart to ask questions about today's visit, request a non-urgent call back, or ask for a work or school excuse from your e-visit provider. You will get an e-mail in the next two days asking about your experience. I hope that your e-visit has been valuable and will speed your recovery.    

## 2017-07-25 MED FILL — DEXTROAMP-AMP 10 MG TAB: 10 | 30 days supply | Qty: 30 | Fill #0

## 2017-07-25 MED FILL — VYVANSE 60 MG CAPSULE: 60 | 30 days supply | Qty: 30 | Fill #0

## 2017-08-22 MED FILL — ATOMOXETINE HCL 80 MG CAPS: 80 | 30 days supply | Qty: 30 | Fill #1

## 2017-08-22 MED FILL — buPROPion HCL ER (XL) 150 M: 150 | 30 days supply | Qty: 30 | Fill #1

## 2017-08-29 MED FILL — DEXTROAMP-AMP 10 MG TAB: 10 | 30 days supply | Qty: 30 | Fill #0

## 2017-08-29 MED FILL — VYVANSE 60 MG CAPSULE: 60 | 30 days supply | Qty: 30 | Fill #0

## 2017-09-12 ENCOUNTER — Encounter: Payer: Self-pay | Admitting: Family Medicine

## 2017-09-12 ENCOUNTER — Ambulatory Visit (INDEPENDENT_AMBULATORY_CARE_PROVIDER_SITE_OTHER): Payer: Self-pay | Admitting: Family Medicine

## 2017-09-12 VITALS — BP 120/78 | HR 113 | Temp 98.1°F | Ht 65.0 in | Wt 161.0 lb

## 2017-09-12 DIAGNOSIS — Z0000 Encounter for general adult medical examination without abnormal findings: Secondary | ICD-10-CM

## 2017-09-12 LAB — POCT URINALYSIS DIPSTICK
GLUCOSE UA: NEGATIVE
KETONES UA: NEGATIVE
Leukocytes, UA: NEGATIVE
Nitrite, UA: NEGATIVE
PH UA: 6 (ref 5.0–8.0)
Protein, UA: POSITIVE — AB
Spec Grav, UA: >=1.03 — AB (ref 1.010–1.025)
UROBILINOGEN UA: 1 U/dL

## 2017-09-12 NOTE — Patient Instructions (Addendum)
Please contact [facility] Care at [facility] [phone] to establish care.   Health Maintenance, Male A healthy lifestyle and preventive care is important for your health and wellness. Ask your health care provider about what schedule of regular examinations is right for you. What should I know about weight and diet? Eat a Healthy Diet  Eat plenty of vegetables, fruits, whole grains, low-fat dairy products, and lean protein.  Do not eat a lot of foods high in solid fats, added sugars, or salt.  Maintain a Healthy Weight Regular exercise can help you achieve or maintain a healthy weight. You should:  Do at least 150 minutes of exercise each week. The exercise should increase your heart rate and make you sweat (moderate-intensity exercise).  Do strength-training exercises at least twice a week.  Watch Your Levels of Cholesterol and Blood Lipids  Have your blood tested for lipids and cholesterol every 5 years starting at 23 years of age. If you are at high risk for heart disease, you should start having your blood tested when you are 23 years old. You may need to have your cholesterol levels checked more often if: ? Your lipid or cholesterol levels are high. ? You are older than 23 years of age. ? You are at high risk for heart disease.  What should I know about cancer screening? Many types of cancers can be detected early and may often be prevented. Lung Cancer  You should be screened every year for lung cancer if: ? You are a current smoker who has smoked for at least 30 years. ? You are a former smoker who has quit within the past 15 years.  Talk to your health care provider about your screening options, when you should start screening, and how often you should be screened.  Colorectal Cancer  Routine colorectal cancer screening usually begins at 23 years of age and should be repeated every 5-10 years until you are 23 years old. You may need to be screened more often if early forms  of precancerous polyps or small growths are found. Your health care provider may recommend screening at an earlier age if you have risk factors for colon cancer.  Your health care provider may recommend using home test kits to check for hidden blood in the stool.  A small camera at the end of a tube can be used to examine your colon (sigmoidoscopy or colonoscopy). This checks for the earliest forms of colorectal cancer.  Prostate and Testicular Cancer  Depending on your age and overall health, your health care provider may do certain tests to screen for prostate and testicular cancer.  Talk to your health care provider about any symptoms or concerns you have about testicular or prostate cancer.  Skin Cancer  Check your skin from head to toe regularly.  Tell your health care provider about any new moles or changes in moles, especially if: ? There is a change in a mole's size, shape, or color. ? You have a mole that is larger than a pencil eraser.  Always use sunscreen. Apply sunscreen liberally and repeat throughout the day.  Protect yourself by wearing long sleeves, pants, a wide-brimmed hat, and sunglasses when outside.  What should I know about heart disease, diabetes, and high blood pressure?  If you are 5018-23 years of age, have your blood pressure checked every 3-5 years. If you are 10740 years of age or older, have your blood pressure checked every year. You should have your blood pressure measured  twice-once when you are at a hospital or clinic, and once when you are not at a hospital or clinic. Record the average of the two measurements. To check your blood pressure when you are not at a hospital or clinic, you can use: ? An automated blood pressure machine at a pharmacy. ? A home blood pressure monitor.  Talk to your health care provider about your target blood pressure.  If you are between 30-31 years old, ask your health care provider if you should take aspirin to prevent heart  disease.  Have regular diabetes screenings by checking your fasting blood sugar level. ? If you are at a normal weight and have a low risk for diabetes, have this test once every three years after the age of 18. ? If you are overweight and have a high risk for diabetes, consider being tested at a younger age or more often.  A one-time screening for abdominal aortic aneurysm (AAA) by ultrasound is recommended for men aged 21-75 years who are current or former smokers. What should I know about preventing infection? Hepatitis B If you have a higher risk for hepatitis B, you should be screened for this virus. Talk with your health care provider to find out if you are at risk for hepatitis B infection. Hepatitis C Blood testing is recommended for:  Everyone born from [birth year] through 1966.  Anyone with known risk factors for hepatitis C.  Sexually Transmitted Diseases (STDs)  You should be screened each year for STDs including gonorrhea and chlamydia if: ? You are sexually active and are younger than 23 years of age. ? You are older than 23 years of age and your health care provider tells you that you are at risk for this type of infection. ? Your sexual activity has changed since you were last screened and you are at an increased risk for chlamydia or gonorrhea. Ask your health care provider if you are at risk.  Talk with your health care provider about whether you are at high risk of being infected with HIV. Your health care provider may recommend a prescription medicine to help prevent HIV infection.  What else can I do?  Schedule regular health, dental, and eye exams.  Stay current with your vaccines (immunizations).  Do not use any tobacco products, such as cigarettes, chewing tobacco, and e-cigarettes. If you need help quitting, ask your health care provider.  Limit alcohol intake to no more than 2 drinks per day. One drink equals 12 ounces of beer, 5 ounces of wine, or 1 ounces of  hard liquor.  Do not use street drugs.  Do not share needles.  Ask your health care provider for help if you need support or information about quitting drugs.  Tell your health care provider if you often feel depressed.  Tell your health care provider if you have ever been abused or do not feel safe at home. This information is not intended to replace advice given to you by your health care provider. Make sure you discuss any questions you have with your health care provider. Document Released: 08/28/2007 Document Revised: 10/29/2015 Document Reviewed: 12/03/2014 Elsevier Interactive Patient Education  [employer].

## 2017-09-12 NOTE — Progress Notes (Signed)
Subjective:  Gordon Coleman is a 22 y.o. male who presents for wellness visit in order to satisfy employee sponsored health insurance benefits. No recent follow-up with a primary care provider. Patient denies any current health related concerns. Although, notes a history of uncontrolled GERD, hiatal hernia, and idiopathic cough. At present he is being treated for ADHD for a mental health provider, however, he is without a primary care provider. Immunization History  Administered Date(s) Administered  . PPD Test 07/18/2017   Family History  Problem Relation Age of Onset  . Arrhythmia Mother   . Heart attack Maternal Uncle        several suffered from heart attacks   Social History   Tobacco Use  . Smoking status: Never Smoker  . Smokeless tobacco: Never Used  Substance Use Topics  . Alcohol use: No  . Drug use: No    No Known Allergies  Current Outpatient Medications  Medication Sig Dispense Refill  . azithromycin (ZITHROMAX) 250 MG tablet Take 500 mg once, then 250 mg for four days 6 tablet 0  . beclomethasone (QVAR) 80 MCG/ACT inhaler Inhale 1 puff into the lungs as needed. 1 Inhaler 1  . budesonide-formoterol (SYMBICORT) 160-4.5 MCG/ACT inhaler Inhale 2 puffs into the lungs 2 (two) times daily. 1 Inhaler 12  . chlorpheniramine-HYDROcodone (TUSSIONEX) 10-8 MG/5ML LQCR Take 5 mLs by mouth every 12 (twelve) hours as needed. 140 mL 2  . esomeprazole (NEXIUM) 40 MG capsule Take 1 capsule (40 mg total) by mouth daily. 30 capsule 0  . ondansetron (ZOFRAN ODT) 4 MG disintegrating tablet Take 1 tablet (4 mg total) by mouth every 8 (eight) hours as needed for nausea or vomiting. 20 tablet 0  . predniSONE (STERAPRED UNI-PAK 21 TAB) 10 MG (21) TBPK tablet Use as directed 21 tablet 0  . ranitidine (ZANTAC) 150 MG capsule Take 1 capsule (150 mg total) by mouth 2 (two) times daily. 60 capsule 12  . traMADol (ULTRAM) 50 MG tablet Take 2 tablets (100 mg total) by mouth every 8 (eight) hours as  needed for pain. 30 tablet 2   No current facility-administered medications for this visit.    ROS Constitutional: Negative for fever, chills, diaphoresis, activity change, appetite change and fatigue. HENT: Negative for ear pain, nosebleeds, congestion, facial swelling, rhinorrhea, neck pain, neck stiffness and ear discharge.  Eyes: Negative for pain, discharge, redness, itching and visual disturbance. Respiratory: Negative for cough, choking, chest tightness, shortness of breath, wheezing and stridor.  Cardiovascular: Negative for chest pain, palpitations and leg swelling. Gastrointestinal: Negative for abdominal distention. Genitourinary:  decreased urine volume and retention (since taking Vyvanse) (following up with prescriber regarding side effects).  Musculoskeletal: Negative for back pain, joint swelling, arthralgia and gait problem. Neurological: Negative for dizziness, tremors, seizures, syncope, facial asymmetry, speech difficulty, weakness, light-headedness, numbness and headaches.  Hematological: Negative for adenopathy. Does not bruise/bleed easily. Psychiatric/Behavioral: Negative for hallucinations, behavioral problems, confusion, dysphoric mood, decreased concentration and agitation.  Objective:  BP 120/78   Pulse (!) 113   Temp 98.1 F (36.7 C)   Ht 5\' 5"  (1.651 m)   Wt 161 lb (73 kg)   SpO2 98%   BMI 26.79 kg/m   Physical Exam: Constitutional: Patient appears well-developed and well-nourished. No distress. HENT: Normocephalic, atraumatic, External right and left ear normal. Oropharynx is clear and moist.  Eyes: Conjunctivae and EOM are normal. PERRLA, no scleral icterus. Neck: Normal ROM. Neck supple. No JVD. No tracheal deviation. No thyromegaly. CVS: RRR, S1/S2 +,  no murmurs, no gallops, no carotid bruit.  Pulmonary: Effort and breath sounds normal, no stridor, rhonchi, wheezes, rales.  Abdominal: Soft. BS +, no distension, tenderness, rebound or guarding. (no  hernia palpated). Musculoskeletal: Normal range of motion. No edema and no tenderness.  Neuro: Alert. Normal reflexes, muscle tone coordination. No cranial nerve deficit. Skin: Skin is warm and dry. No rash noted. Not diaphoretic. No erythema. No pallor. Psychiatric: Normal mood and affect. Behavior, judgment, thought content normal. Assessment and Plan:  1. Wellness examination- Age-appropriate anticipatory guidance provided  Encouraged efforts to reduce weight include engaging in physical activity as tolerated with goal of 150 minutes per week. Follow-up with primary care to have abnormal urine evaluated.  Godfrey Pick. Tiburcio Pea, MSN, FNP-C [facility]  [address]  [place], [place] [address] [phone]

## 2017-09-29 MED FILL — AMPHETAMINE-DEXTROAMPHETAMI: 10 | 30 days supply | Qty: 30 | Fill #0

## 2017-09-29 MED FILL — VYVANSE 60 MG CAPSULE: 60 | 30 days supply | Qty: 30 | Fill #0

## 2017-09-30 MED FILL — buPROPion HCL ER (XL) 150 M: 150 | 30 days supply | Qty: 30 | Fill #2

## 2017-09-30 MED FILL — ATOMOXETINE HCL 80 MG CAPS: 80 | 30 days supply | Qty: 30 | Fill #2

## 2017-10-04 DIAGNOSIS — F902 Attention-deficit hyperactivity disorder, combined type: Secondary | ICD-10-CM | POA: Diagnosis not present

## 2017-10-04 DIAGNOSIS — F419 Anxiety disorder, unspecified: Secondary | ICD-10-CM | POA: Diagnosis not present

## 2017-10-04 DIAGNOSIS — F338 Other recurrent depressive disorders: Secondary | ICD-10-CM | POA: Diagnosis not present

## 2017-10-04 DIAGNOSIS — Z79899 Other long term (current) drug therapy: Secondary | ICD-10-CM | POA: Diagnosis not present

## 2017-10-04 MED FILL — cloNIDine HCL ER 0.1 MG TB1: 0.1 | 37 days supply | Qty: 60 | Fill #0

## 2017-10-31 MED FILL — ATOMOXETINE HCL 80 MG CAPS: 80 | 30 days supply | Qty: 30 | Fill #3

## 2017-10-31 MED FILL — buPROPion HCL ER (XL) 150 M: 150 | 30 days supply | Qty: 30 | Fill #3

## 2017-10-31 MED FILL — ADDERALL XR 30 MG CAP SA: 30 | 30 days supply | Qty: 30 | Fill #0

## 2017-11-02 MED FILL — cloNIDine HCL ER 0.1 MG TB1: 0.1 | 30 days supply | Qty: 60 | Fill #0

## 2017-11-29 MED FILL — AMPHETAMINE-DEXTROAMPHETAMI: 10 | 30 days supply | Qty: 30 | Fill #0

## 2017-11-29 MED FILL — ADDERALL XR 30 MG CAP SA: 30 | 30 days supply | Qty: 30 | Fill #0

## 2017-11-30 MED FILL — cloNIDine HCL ER 0.1 MG TB1: 0.1 | 30 days supply | Qty: 60 | Fill #1

## 2017-12-01 MED FILL — buPROPion HCL ER (XL) 150 M: 150 | 30 days supply | Qty: 30 | Fill #0

## 2018-01-06 DIAGNOSIS — F902 Attention-deficit hyperactivity disorder, combined type: Secondary | ICD-10-CM | POA: Diagnosis not present

## 2018-01-06 DIAGNOSIS — F419 Anxiety disorder, unspecified: Secondary | ICD-10-CM | POA: Diagnosis not present

## 2018-01-06 DIAGNOSIS — F338 Other recurrent depressive disorders: Secondary | ICD-10-CM | POA: Diagnosis not present

## 2018-01-06 DIAGNOSIS — Z79899 Other long term (current) drug therapy: Secondary | ICD-10-CM | POA: Diagnosis not present

## 2018-01-06 MED FILL — guanFACINE HCL ER 1 MG TB24: 1 | 30 days supply | Qty: 30 | Fill #0

## 2018-01-06 MED FILL — ADDERALL XR 20 MG CAP SA: 20 | 30 days supply | Qty: 60 | Fill #0

## 2018-01-06 MED FILL — AMPHETAMINE-DEXTROAMPHETAMI: 10 | 30 days supply | Qty: 30 | Fill #0

## 2018-02-17 MED FILL — AMPHETAMINE-DEXTROAMPHETAMI: 10 | 30 days supply | Qty: 30 | Fill #0

## 2018-02-17 MED FILL — ADDERALL XR 20 MG CAP SA: 20 | 30 days supply | Qty: 60 | Fill #0

## 2018-02-17 MED FILL — buPROPion HCL ER (XL) 150 M: 150 | 30 days supply | Qty: 30 | Fill #0

## 2018-02-17 MED FILL — guanFACINE HCL ER 2 MG TB24: 2 | 30 days supply | Qty: 30 | Fill #0

## 2018-03-17 MED FILL — guanFACINE HCL ER 2 MG TB24: 2 | 30 days supply | Qty: 30 | Fill #1

## 2018-03-24 MED FILL — ADDERALL XR 20 MG CAP SA: 20 | 30 days supply | Qty: 60 | Fill #0

## 2018-03-24 MED FILL — AMPHETAMINE-DEXTROAMPHETAMI: 10 | 30 days supply | Qty: 30 | Fill #0

## 2018-04-07 DIAGNOSIS — F419 Anxiety disorder, unspecified: Secondary | ICD-10-CM | POA: Diagnosis not present

## 2018-04-07 DIAGNOSIS — F902 Attention-deficit hyperactivity disorder, combined type: Secondary | ICD-10-CM | POA: Diagnosis not present

## 2018-04-07 DIAGNOSIS — F338 Other recurrent depressive disorders: Secondary | ICD-10-CM | POA: Diagnosis not present

## 2018-04-07 DIAGNOSIS — Z79899 Other long term (current) drug therapy: Secondary | ICD-10-CM | POA: Diagnosis not present

## 2018-05-08 MED FILL — guanFACINE HCL ER 2 MG TB24: 2 | 30 days supply | Qty: 30 | Fill #0

## 2018-05-16 MED FILL — AMPHETAMINE-DEXTROAMPHETAMI: 10 | 30 days supply | Qty: 30 | Fill #0

## 2018-05-16 MED FILL — ADDERALL XR 20 MG CAP SA: 20 | 30 days supply | Qty: 60 | Fill #0

## 2018-05-23 ENCOUNTER — Telehealth: Payer: 59 | Admitting: Physician Assistant

## 2018-05-23 DIAGNOSIS — K219 Gastro-esophageal reflux disease without esophagitis: Secondary | ICD-10-CM | POA: Diagnosis not present

## 2018-05-23 MED ORDER — FAMOTIDINE 20 MG PO TABS: ORAL_TABLET | ORAL | 0 refills | Status: AC

## 2018-05-23 MED ORDER — PANTOPRAZOLE SODIUM 40 MG PO TBEC: 40.0000 mg | DELAYED_RELEASE_TABLET | Freq: Every day | ORAL | 0 refills | Status: DC

## 2018-05-23 MED FILL — FAMOTIDINE 20 MG TABLET: 20 | 30 days supply | Qty: 60 | Fill #0

## 2018-05-23 MED FILL — PANTOPRAZOLE SOD DR 40 MG T: 40 | 30 days supply | Qty: 30 | Fill #0

## 2018-05-23 NOTE — Progress Notes (Signed)
We are sorry that you are not feeling well.  Here is how we plan to help!  Based on what you shared with me it looks like you most likely have Gastroesophageal Reflux Disease (GERD)  Gastroesophageal reflux disease (GERD) happens when acid from your stomach flows up into the esophagus.  When acid comes in contact with the esophagus, the acid causes sorenss (inflammation) in the esophagus.  Over time, GERD may create small holes (ulcers) in the lining of the esophagus.  Nexium is a very expensive proton pump inhibitor, even with a prescription. I am prescribing one months worth of pantoprazole 40 mg in the morning along with 1 months supply of famotidine 20 mg.  It is it is important to take pantoprazole first thing when you wake up and to eat something 30 minutes later.  This helps the medication work 3 times better than if you do not eat or if you take at another time during the day.  Instead of taking Nexium or another PPI in the evening, I suggest that you take 20 mg of famotidine otherwise known as Pepcid just before lunch and just before dinner.  Something else that can be really helpful this to elevate the head of your bed by taking some large books or bricks and placing them onto the bedpost at the head of the bed.  I am providing you with 30 days of both medications to give you time to find a primary care provider who can help you with this problem.  Home Care: May include lifestyle changes such as weight loss, quitting smoking and alcohol consumption Avoid foods and drinks that make your symptoms worse, such as: Caffeine or alcoholic drinks Chocolate Peppermint or mint flavorings Garlic and onions Spicy foods Citrus fruits, such as oranges, lemons, or limes Tomato-based foods such as sauce, chili, salsa and pizza Fried and fatty foods Avoid lying down for 3 hours prior to your bedtime or prior to taking a nap Eat small, frequent meals instead of a large meals Wear loose-fitting  clothing.  Do not wear anything tight around your waist that causes pressure on your stomach.  Seek Help Right Away If: You have pain in your arms, neck, jaw, teeth or back Your pain increases or changes in intensity or duration You develop nausea, vomiting or sweating (diaphoresis) You develop shortness of breath or you faint Your vomit is green, yellow, black or looks like coffee grounds or blood Your stool is red, bloody or black  These symptoms could be signs of other problems, such as heart disease, gastric bleeding or esophageal bleeding.  Make sure you : Understand these instructions. Will watch your condition. Will get help right away if you are not doing well or get worse.  Your e-visit answers were reviewed by a board certified advanced clinical practitioner to complete your personal care plan.  Depending on the condition, your plan could have included both over the counter or prescription medications.  If there is a problem please reply once you have received a response from your provider.  Your safety is important to [place].  If you have drug allergies check your prescription carefully.    You can use MyChart to ask questions about today's visit, request a non-urgent call back, or ask for a work or school excuse for 24 hours related to this e-Visit. If it has been greater than 24 hours you will need to follow up with your provider, or enter a new e-Visit to address those concerns.  You will get an e-mail in the next two days asking about your experience.  I hope that your e-visit has been valuable and will speed your recovery. Thank you for using e-visits.    ===View-only below this line===   ----- Message -----    From: Malachy Moan    Sent: 05/23/2018 10:30 AM EDT      To: E-Visit Mailing List Subject: E-Visit Submission: Heartburn  E-Visit Submission: Heartburn --------------------------------  Question: How long have you had heartburn? Answer:   A year or  more  Question: How often do you experience heartburn? Answer:   All the time  Question: How long does your heartburn last? Answer:   I have it all the time  Question: Where do you feel the heartburn? Answer:   In the middle of my chest            In my stomach  Question: Does your heartburn wake you from sleep? Answer:   Yes  Question: Does your heartburn limit your ability to do things you need to do? Answer:   Yes  Question: Does your heartburn change depending on whether you are sitting, standing, or lying down? Answer:   Yes  Question: Which of the following are you experiencing? Answer:   Stomach fluid entering the back of your throat            Fullness in the back of your throat  Question: Do you have any of the following? Answer:   Cough  Question: Do you have trouble swallowing? Answer:   No  Question: Do you feel full after eating less than usual? Answer:   No  Question: Do you have any of the following? Answer:   None of the above  Question: Which of  the following makes the heartburn worse? Answer:   Drinking alcohol            Drinks with caffeine            Eating certain foods            Lying down            Tight clothing            Stress  Question: Do you have any of the following? Answer:   None of the above  Question: Have you lost weight lately without trying? Answer:   No  Question: Have you ever been told you have the following? Answer:   Hiatal hernia  Question: Have you seen a healthcare provider for your heartburn? Answer:   I saw someone over a year ago  Question: Have you taken any medicines to relieve your heartburn in the past? Answer:   Antacids (Tums, Rolaids, Malox, other)            H2 Blockers (Pepcid, Tagament, Zantac, Axid)            PPIs (Prilosec, Nexium, Prevacid, Prontonix)  Question: Have the medicines provided relief? Answer:   Yes  Question: Have you had any of the following for heartburn? Answer:   None  of the above  Question: Please list your medication allergies that you may have ? (If 'none' , please list as 'none') Answer:   None  Question: Please list any additional comments  Answer:   Have taken PPIs for 27yrs and since 2014 take 80mg  esomeprazole (20mg  4x/day) and famotidine or tums PRN. Would it be possible to get a prescription for esomeprazole as I have had before,  due to the expense of buying such a high dose OTC? Used to see GI doc but was too costly, and no PCP currently though I'm trying to set up appointment - long wait for appointments at [facility] facility. Thanks!  A total of 5-10 minutes was spent evaluating this patients questionnaire and formulating a plan of care.

## 2018-06-01 MED FILL — guanFACINE HCL ER 2 MG TB24: 2 | 30 days supply | Qty: 30 | Fill #1

## 2018-06-25 DIAGNOSIS — K047 Periapical abscess without sinus: Secondary | ICD-10-CM | POA: Diagnosis not present

## 2018-06-27 MED FILL — AMPHETAMINE-DEXTROAMPHETAMI: 10 | 30 days supply | Qty: 30 | Fill #0

## 2018-06-27 MED FILL — ADDERALL XR 20 MG CAP SA: 20 | 30 days supply | Qty: 60 | Fill #0

## 2018-07-03 MED FILL — guanFACINE HCL ER 2 MG TB24: 2 | 30 days supply | Qty: 30 | Fill #2

## 2018-07-03 MED FILL — HYDROCODON-APAP 5-325: 5-325 | 4 days supply | Qty: 16 | Fill #0

## 2018-07-03 MED FILL — CLINDAMYCIN HCL 150 MG CAPS: 150 | 7 days supply | Qty: 28 | Fill #0

## 2018-08-08 MED FILL — guanFACINE HCL ER 2 MG TB24: 2 | 30 days supply | Qty: 30 | Fill #3

## 2018-08-22 MED FILL — ADDERALL XR 20 MG CAP SA: 20 | 30 days supply | Qty: 60 | Fill #0

## 2018-08-22 MED FILL — AMPHETAMINE-DEXTROAMPHETAMI: 10 | 30 days supply | Qty: 30 | Fill #0

## 2018-09-06 MED FILL — guanFACINE HCL ER 2 MG TB24: 2 | 30 days supply | Qty: 30 | Fill #4

## 2018-10-11 DIAGNOSIS — F338 Other recurrent depressive disorders: Secondary | ICD-10-CM | POA: Diagnosis not present

## 2018-10-11 DIAGNOSIS — F419 Anxiety disorder, unspecified: Secondary | ICD-10-CM | POA: Diagnosis not present

## 2018-10-11 DIAGNOSIS — F902 Attention-deficit hyperactivity disorder, combined type: Secondary | ICD-10-CM | POA: Diagnosis not present

## 2018-10-11 MED FILL — guanFACINE HCL ER 2 MG TB24: 2 | 30 days supply | Qty: 30 | Fill #5

## 2018-10-11 MED FILL — HYDROXYZINE PAMOATE 25 MG C: 25 | 30 days supply | Qty: 60 | Fill #0

## 2018-10-11 MED FILL — ADDERALL XR 20 MG CAP SA: 20 | 30 days supply | Qty: 60 | Fill #0

## 2018-10-11 MED FILL — FLUoxetine HCL 10 MG CAPS: 10 | 30 days supply | Qty: 30 | Fill #0

## 2018-10-12 MED FILL — AMPHETAMINE-DEXTROAMPHETAMI: 10 | 30 days supply | Qty: 30 | Fill #0

## 2018-10-17 MED FILL — HYDROCODON-APAP 5-325: 5-325 | 2 days supply | Qty: 14 | Fill #0

## 2018-10-17 MED FILL — CLINDAMYCIN HCL 150 MG CAPS: 150 | 7 days supply | Qty: 28 | Fill #0

## 2018-11-10 MED FILL — ADDERALL XR 20 MG CAP SA: 20 | 30 days supply | Qty: 60 | Fill #0

## 2018-11-10 MED FILL — FLUoxetine HCL 10 MG CAPS: 10 | 30 days supply | Qty: 30 | Fill #1

## 2018-11-10 MED FILL — HYDROXYZINE PAMOATE 25 MG C: 25 | 30 days supply | Qty: 60 | Fill #1

## 2018-11-10 MED FILL — AMPHETAMINE-DEXTROAMPHETAMI: 10 | 30 days supply | Qty: 30 | Fill #0

## 2018-11-10 MED FILL — guanFACINE HCL ER 2 MG TB24: 2 | 30 days supply | Qty: 30 | Fill #0

## 2018-12-04 DIAGNOSIS — Z20828 Contact with and (suspected) exposure to other viral communicable diseases: Secondary | ICD-10-CM | POA: Diagnosis not present

## 2018-12-11 MED FILL — guanFACINE HCL ER 2 MG TB24: 2 | 30 days supply | Qty: 30 | Fill #1

## 2018-12-11 MED FILL — FLUoxetine HCL 10 MG CAPS: 10 | 30 days supply | Qty: 30 | Fill #2

## 2018-12-11 MED FILL — HYDROXYZINE PAMOATE 25 MG C: 25 | 30 days supply | Qty: 60 | Fill #2

## 2019-01-05 MED FILL — AMPHETAMINE-DEXTROAMPHETAMI: 10 | 30 days supply | Qty: 30 | Fill #0

## 2019-01-05 MED FILL — FLUoxetine HCL 20 MG CAPS: 20 | 30 days supply | Qty: 30 | Fill #0

## 2019-01-05 MED FILL — ADDERALL XR 20 MG CAP SA: 20 | 30 days supply | Qty: 60 | Fill #0

## 2019-01-05 MED FILL — guanFACINE HCL ER 2 MG TB24: 2 | 30 days supply | Qty: 30 | Fill #2

## 2019-02-05 MED FILL — FLUoxetine HCL 20 MG CAPS: 20 | 30 days supply | Qty: 30 | Fill #1

## 2019-02-05 MED FILL — guanFACINE HCL ER 2 MG TB24: 2 | 30 days supply | Qty: 30 | Fill #3

## 2019-03-08 MED FILL — FLUoxetine HCL 20 MG CAPS: 20 | 30 days supply | Qty: 30 | Fill #2

## 2019-03-08 MED FILL — guanFACINE HCL ER 2 MG TB24: 2 | 30 days supply | Qty: 30 | Fill #4

## 2019-03-23 MED FILL — AMPHETAMINE-DEXTROAMPHETAMI: 10 | 30 days supply | Qty: 30 | Fill #0

## 2019-03-23 MED FILL — ADDERALL XR 20 MG CAP SA: 20 | 30 days supply | Qty: 60 | Fill #0

## 2019-04-10 MED FILL — guanFACINE HCL ER 2 MG TB24: 2 | 30 days supply | Qty: 30 | Fill #5

## 2019-04-11 MED FILL — FLUoxetine HCL 20 MG CAPS: 20 | 30 days supply | Qty: 30 | Fill #0

## 2019-05-11 MED FILL — FLUoxetine HCL 20 MG CAPS: 20 | 30 days supply | Qty: 30 | Fill #1

## 2019-05-11 MED FILL — guanFACINE HCL ER 2 MG TB24: 2 | 30 days supply | Qty: 30 | Fill #0

## 2019-05-15 DIAGNOSIS — F419 Anxiety disorder, unspecified: Secondary | ICD-10-CM | POA: Diagnosis not present

## 2019-05-15 DIAGNOSIS — Z79899 Other long term (current) drug therapy: Secondary | ICD-10-CM | POA: Diagnosis not present

## 2019-05-15 DIAGNOSIS — F902 Attention-deficit hyperactivity disorder, combined type: Secondary | ICD-10-CM | POA: Diagnosis not present

## 2019-05-15 DIAGNOSIS — F338 Other recurrent depressive disorders: Secondary | ICD-10-CM | POA: Diagnosis not present

## 2019-05-15 MED FILL — FLUoxetine HCL 40 MG CAPS: 40 | 30 days supply | Qty: 30 | Fill #0

## 2019-05-15 MED FILL — ADDERALL XR 20 MG CAP SA: 20 | 30 days supply | Qty: 60 | Fill #0

## 2019-05-15 MED FILL — AMPHETAMINE-DEXTROAMPHETAMI: 10 | 30 days supply | Qty: 30 | Fill #0

## 2019-06-09 MED FILL — FLUoxetine HCL 40 MG CAPS: 40 | 30 days supply | Qty: 30 | Fill #1

## 2019-06-11 MED FILL — guanFACINE HCL ER 2 MG TB24: 2 | 30 days supply | Qty: 30 | Fill #0

## 2019-06-25 MED FILL — ADDERALL XR 20 MG CAP SA: 20 | 30 days supply | Qty: 60 | Fill #0

## 2019-06-25 MED FILL — AMPHETAMINE-DEXTROAMPHETAMI: 10 | 30 days supply | Qty: 30 | Fill #0

## 2019-06-28 MED FILL — FLUoxetine HCL 10 MG CAPS: 10 | 30 days supply | Qty: 90 | Fill #0

## 2019-07-18 MED FILL — guanFACINE HCL ER 2 MG TB24: 2 | 30 days supply | Qty: 30 | Fill #1

## 2019-08-10 MED FILL — FLUoxetine HCL 10 MG CAPS: 10 | 30 days supply | Qty: 90 | Fill #1

## 2019-08-11 MED FILL — guanFACINE HCL ER 2 MG TB24: 2 | 30 days supply | Qty: 30 | Fill #2

## 2019-08-14 MED FILL — ADDERALL XR 20 MG CAP SA: 20 | 30 days supply | Qty: 60 | Fill #0

## 2019-08-14 MED FILL — AMPHETAMINE-DEXTROAMPHETAMI: 10 | 30 days supply | Qty: 30 | Fill #0

## 2019-09-21 MED FILL — guanFACINE HCL ER 2 MG TB24: 2 | 30 days supply | Qty: 30 | Fill #3

## 2019-09-21 MED FILL — FLUoxetine HCL 10 MG CAPS: 10 | 30 days supply | Qty: 90 | Fill #2

## 2019-09-24 MED FILL — DEXTROAMP-AMP 10 MG TAB: 10 | 30 days supply | Qty: 30 | Fill #0

## 2019-09-24 MED FILL — ADDERALL XR 20 MG CAP SA: 20 | 30 days supply | Qty: 60 | Fill #0

## 2019-10-01 DIAGNOSIS — Z79899 Other long term (current) drug therapy: Secondary | ICD-10-CM | POA: Diagnosis not present

## 2019-10-01 DIAGNOSIS — F338 Other recurrent depressive disorders: Secondary | ICD-10-CM | POA: Diagnosis not present

## 2019-10-01 DIAGNOSIS — F419 Anxiety disorder, unspecified: Secondary | ICD-10-CM | POA: Diagnosis not present

## 2019-10-01 DIAGNOSIS — F902 Attention-deficit hyperactivity disorder, combined type: Secondary | ICD-10-CM | POA: Diagnosis not present

## 2019-10-25 MED FILL — FLUoxetine HCL 10 MG CAPS: 10 | 30 days supply | Qty: 90 | Fill #3

## 2019-10-25 MED FILL — guanFACINE HCL ER 2 MG TB24: 2 | 30 days supply | Qty: 30 | Fill #4

## 2019-11-09 MED FILL — ADDERALL XR 20 MG CAP SA: 20 | 30 days supply | Qty: 60 | Fill #0

## 2019-11-09 MED FILL — AMPHETAMINE SALTS 10 MG: 10 | 30 days supply | Qty: 30 | Fill #0

## 2019-11-29 MED FILL — guanFACINE HCL ER 2 MG TB24: 2 | 30 days supply | Qty: 30 | Fill #5

## 2019-12-19 ENCOUNTER — Other Ambulatory Visit (HOSPITAL_COMMUNITY): Payer: Self-pay | Admitting: Internal Medicine

## 2019-12-19 MED FILL — AMPHETAMINE SALTS 10 MG: 10 | 30 days supply | Qty: 30 | Fill #0

## 2019-12-19 MED FILL — ADDERALL XR 20 MG CAP SA: 20 | 30 days supply | Qty: 60 | Fill #0

## 2019-12-31 ENCOUNTER — Other Ambulatory Visit (HOSPITAL_COMMUNITY): Payer: Self-pay | Admitting: Internal Medicine

## 2019-12-31 MED FILL — FLUoxetine HCL 10 MG CAPS: 10 | 30 days supply | Qty: 90 | Fill #0

## 2019-12-31 MED FILL — guanFACINE HCL ER 2 MG TB24: 2 | 30 days supply | Qty: 30 | Fill #0

## 2020-01-08 DIAGNOSIS — F902 Attention-deficit hyperactivity disorder, combined type: Secondary | ICD-10-CM | POA: Diagnosis not present

## 2020-01-08 DIAGNOSIS — Z79899 Other long term (current) drug therapy: Secondary | ICD-10-CM | POA: Diagnosis not present

## 2020-01-08 DIAGNOSIS — F419 Anxiety disorder, unspecified: Secondary | ICD-10-CM | POA: Diagnosis not present

## 2020-02-05 MED FILL — guanFACINE HCL ER 2 MG TB24: 2 | 30 days supply | Qty: 30 | Fill #1

## 2020-02-05 MED FILL — FLUoxetine HCL 10 MG CAPS: 10 | 30 days supply | Qty: 90 | Fill #1

## 2020-03-04 MED FILL — FLUoxetine HCL 10 MG CAPS: 10 | 30 days supply | Qty: 90 | Fill #2

## 2020-03-04 MED FILL — guanFACINE HCL ER 2 MG TB24: 2 | 30 days supply | Qty: 30 | Fill #2

## 2020-03-05 ENCOUNTER — Other Ambulatory Visit (HOSPITAL_COMMUNITY): Payer: Self-pay | Admitting: Internal Medicine

## 2020-03-05 MED FILL — AMPHETAMINE SALTS 10 MG: 10 | 30 days supply | Qty: 30 | Fill #0

## 2020-03-05 MED FILL — ADDERALL XR 20 MG CAP SA: 20 | 30 days supply | Qty: 60 | Fill #0

## 2020-03-06 DIAGNOSIS — H5213 Myopia, bilateral: Secondary | ICD-10-CM | POA: Diagnosis not present

## 2020-04-08 ENCOUNTER — Other Ambulatory Visit (HOSPITAL_COMMUNITY): Payer: Self-pay | Admitting: Internal Medicine

## 2020-04-08 DIAGNOSIS — Z79899 Other long term (current) drug therapy: Secondary | ICD-10-CM | POA: Diagnosis not present

## 2020-04-08 DIAGNOSIS — F419 Anxiety disorder, unspecified: Secondary | ICD-10-CM | POA: Diagnosis not present

## 2020-04-08 DIAGNOSIS — F902 Attention-deficit hyperactivity disorder, combined type: Secondary | ICD-10-CM | POA: Diagnosis not present

## 2020-04-08 MED FILL — AMPHETAMINE SALTS 10 MG: 10 | 30 days supply | Qty: 30 | Fill #0

## 2020-04-08 MED FILL — ADDERALL XR 20 MG CAP SA: 20 | 30 days supply | Qty: 60 | Fill #0

## 2020-04-10 MED FILL — guanFACINE HCL ER 2 MG TB24: 2 | 30 days supply | Qty: 30 | Fill #3

## 2020-04-10 MED FILL — FLUoxetine HCL 10 MG CAPS: 10 | 30 days supply | Qty: 90 | Fill #3

## 2020-05-09 MED FILL — guanFACINE HCL ER 2 MG TB24: 2 | 30 days supply | Qty: 30 | Fill #4

## 2020-05-12 ENCOUNTER — Other Ambulatory Visit (HOSPITAL_COMMUNITY): Payer: Self-pay | Admitting: Internal Medicine

## 2020-05-12 MED FILL — AMPHETAMINE SALTS 10 MG: 10 | 30 days supply | Qty: 30 | Fill #0

## 2020-05-12 MED FILL — ADDERALL XR 20 MG CAP SA: 20 | 30 days supply | Qty: 60 | Fill #0

## 2020-05-23 MED FILL — ADDERALL XR 20 MG CAP SA: 20 | 30 days supply | Qty: 60 | Fill #0

## 2020-06-12 MED FILL — guanFACINE HCL ER 2 MG TB24: 2 | 30 days supply | Qty: 30 | Fill #5

## 2020-07-11 ENCOUNTER — Other Ambulatory Visit (HOSPITAL_COMMUNITY): Payer: Self-pay

## 2020-07-11 MED ORDER — FLUOXETINE HCL 10 MG PO CAPS
20.0000 mg | ORAL_CAPSULE | Freq: Every day | ORAL | 3 refills | Status: DC
  Filled 2020-07-11: qty 60, 30d supply, fill #0
  Filled 2020-08-14: qty 60, 30d supply, fill #1
  Filled 2020-09-11: qty 60, 30d supply, fill #2
  Filled 2020-10-15: qty 60, 30d supply, fill #3

## 2020-07-11 MED ORDER — GUANFACINE HCL ER 2 MG PO TB24
2.0000 mg | ORAL_TABLET | Freq: Every day | ORAL | 5 refills | Status: DC
  Filled 2020-07-11: qty 30, 30d supply, fill #0
  Filled 2020-08-14: qty 30, 30d supply, fill #1
  Filled 2020-09-11: qty 30, 30d supply, fill #2
  Filled 2020-10-15: qty 30, 30d supply, fill #3
  Filled 2020-11-13: qty 30, 30d supply, fill #4
  Filled 2020-12-14: qty 30, 30d supply, fill #5

## 2020-07-11 MED ORDER — AMPHETAMINE-DEXTROAMPHET ER 20 MG PO CP24
40.0000 mg | ORAL_CAPSULE | Freq: Every day | ORAL | 0 refills | Status: DC
  Filled 2020-07-11: qty 60, 30d supply, fill #0

## 2020-07-12 ENCOUNTER — Other Ambulatory Visit (HOSPITAL_COMMUNITY): Payer: Self-pay

## 2020-07-23 ENCOUNTER — Other Ambulatory Visit (HOSPITAL_COMMUNITY): Payer: Self-pay

## 2020-07-23 DIAGNOSIS — F338 Other recurrent depressive disorders: Secondary | ICD-10-CM | POA: Diagnosis not present

## 2020-07-23 DIAGNOSIS — Z79899 Other long term (current) drug therapy: Secondary | ICD-10-CM | POA: Diagnosis not present

## 2020-07-23 DIAGNOSIS — F902 Attention-deficit hyperactivity disorder, combined type: Secondary | ICD-10-CM | POA: Diagnosis not present

## 2020-07-23 DIAGNOSIS — F419 Anxiety disorder, unspecified: Secondary | ICD-10-CM | POA: Diagnosis not present

## 2020-07-23 MED ORDER — AMPHETAMINE-DEXTROAMPHETAMINE 10 MG PO TABS
ORAL_TABLET | ORAL | 0 refills | Status: DC
  Filled 2020-07-23: qty 30, 30d supply, fill #0

## 2020-08-13 ENCOUNTER — Other Ambulatory Visit (HOSPITAL_COMMUNITY): Payer: Self-pay

## 2020-08-13 MED ORDER — AMPHETAMINE-DEXTROAMPHET ER 20 MG PO CP24
ORAL_CAPSULE | ORAL | 0 refills | Status: DC
  Filled 2020-08-13: qty 60, 30d supply, fill #0

## 2020-08-14 ENCOUNTER — Other Ambulatory Visit (HOSPITAL_COMMUNITY): Payer: Self-pay

## 2020-09-03 ENCOUNTER — Ambulatory Visit: Payer: Self-pay | Admitting: Medical

## 2020-09-11 ENCOUNTER — Other Ambulatory Visit (HOSPITAL_COMMUNITY): Payer: Self-pay

## 2020-09-30 ENCOUNTER — Other Ambulatory Visit (HOSPITAL_COMMUNITY): Payer: Self-pay

## 2020-09-30 MED ORDER — AMPHETAMINE-DEXTROAMPHETAMINE 10 MG PO TABS
5.0000 mg | ORAL_TABLET | Freq: Every day | ORAL | 0 refills | Status: DC
  Filled 2020-09-30: qty 30, 30d supply, fill #0

## 2020-09-30 MED ORDER — AMPHETAMINE-DEXTROAMPHET ER 20 MG PO CP24
40.0000 mg | ORAL_CAPSULE | Freq: Every day | ORAL | 0 refills | Status: DC
  Filled 2020-09-30: qty 60, 30d supply, fill #0

## 2020-10-15 ENCOUNTER — Other Ambulatory Visit (HOSPITAL_COMMUNITY): Payer: Self-pay

## 2020-10-22 ENCOUNTER — Other Ambulatory Visit (HOSPITAL_COMMUNITY): Payer: Self-pay

## 2020-10-22 DIAGNOSIS — F419 Anxiety disorder, unspecified: Secondary | ICD-10-CM | POA: Diagnosis not present

## 2020-10-22 DIAGNOSIS — Z79899 Other long term (current) drug therapy: Secondary | ICD-10-CM | POA: Diagnosis not present

## 2020-10-22 DIAGNOSIS — F902 Attention-deficit hyperactivity disorder, combined type: Secondary | ICD-10-CM | POA: Diagnosis not present

## 2020-10-22 DIAGNOSIS — F338 Other recurrent depressive disorders: Secondary | ICD-10-CM | POA: Diagnosis not present

## 2020-10-22 MED ORDER — FLUOXETINE HCL 10 MG PO CAPS
30.0000 mg | ORAL_CAPSULE | Freq: Every day | ORAL | 3 refills | Status: DC
  Filled 2020-10-22: qty 90, 30d supply, fill #0
  Filled 2020-12-14: qty 90, 30d supply, fill #1
  Filled 2021-01-22: qty 90, 30d supply, fill #2
  Filled 2021-02-09: qty 90, 30d supply, fill #3

## 2020-10-23 DIAGNOSIS — F902 Attention-deficit hyperactivity disorder, combined type: Secondary | ICD-10-CM | POA: Diagnosis not present

## 2020-10-23 DIAGNOSIS — Z79899 Other long term (current) drug therapy: Secondary | ICD-10-CM | POA: Diagnosis not present

## 2020-10-30 ENCOUNTER — Other Ambulatory Visit (HOSPITAL_COMMUNITY): Payer: Self-pay

## 2020-11-08 ENCOUNTER — Other Ambulatory Visit (HOSPITAL_COMMUNITY): Payer: Self-pay

## 2020-11-13 ENCOUNTER — Other Ambulatory Visit (HOSPITAL_COMMUNITY): Payer: Self-pay

## 2020-11-13 MED ORDER — AMPHETAMINE-DEXTROAMPHET ER 20 MG PO CP24
ORAL_CAPSULE | ORAL | 0 refills | Status: DC
  Filled 2020-11-13: qty 60, 30d supply, fill #0

## 2020-11-13 MED ORDER — AMPHETAMINE-DEXTROAMPHETAMINE 10 MG PO TABS
ORAL_TABLET | ORAL | 0 refills | Status: DC
  Filled 2020-11-13: qty 30, 30d supply, fill #0

## 2020-12-15 ENCOUNTER — Other Ambulatory Visit (HOSPITAL_COMMUNITY): Payer: Self-pay

## 2020-12-23 ENCOUNTER — Other Ambulatory Visit (HOSPITAL_COMMUNITY): Payer: Self-pay

## 2020-12-23 MED ORDER — AMPHETAMINE-DEXTROAMPHETAMINE 10 MG PO TABS
ORAL_TABLET | ORAL | 0 refills | Status: DC
  Filled 2020-12-23: qty 30, 30d supply, fill #0

## 2020-12-23 MED ORDER — AMPHETAMINE-DEXTROAMPHET ER 20 MG PO CP24
40.0000 mg | ORAL_CAPSULE | Freq: Every day | ORAL | 0 refills | Status: DC
  Filled 2020-12-23: qty 60, 30d supply, fill #0

## 2021-01-13 ENCOUNTER — Other Ambulatory Visit (HOSPITAL_COMMUNITY): Payer: Self-pay

## 2021-01-13 MED ORDER — GUANFACINE HCL ER 2 MG PO TB24
2.0000 mg | ORAL_TABLET | Freq: Every day | ORAL | 5 refills | Status: DC
  Filled 2021-01-13: qty 30, 30d supply, fill #0
  Filled 2021-02-09: qty 30, 30d supply, fill #1
  Filled 2021-03-02 – 2021-03-19 (×2): qty 30, 30d supply, fill #2
  Filled 2021-04-23: qty 30, 30d supply, fill #3
  Filled 2021-05-22: qty 30, 30d supply, fill #4
  Filled 2021-06-24: qty 30, 30d supply, fill #5

## 2021-01-16 ENCOUNTER — Telehealth: Payer: 59 | Admitting: Nurse Practitioner

## 2021-01-16 DIAGNOSIS — R112 Nausea with vomiting, unspecified: Secondary | ICD-10-CM | POA: Diagnosis not present

## 2021-01-16 MED ORDER — ONDANSETRON HCL 4 MG PO TABS: 4.0000 mg | ORAL_TABLET | Freq: Three times a day (TID) | ORAL | 0 refills | Status: DC | PRN

## 2021-01-16 NOTE — Progress Notes (Signed)
E-Visit for Nausea and Vomiting   We are sorry that you are not feeling well. Here is how we plan to help!  Based on what you have shared with me it looks like you have a Virus that is irritating your GI tract.  Vomiting is the forceful emptying of a portion of the stomach's content through the mouth.  Although nausea and vomiting can make you feel miserable, it's important to remember that these are not diseases, but rather symptoms of an underlying illness.  When we treat short term symptoms, we always caution that any symptoms that persist should be fully evaluated in a medical office.  I have prescribed a medication that will help alleviate your symptoms and allow you to stay hydrated:  Zofran 4 mg 1 tablet every 8 hours as needed for nausea and vomiting  HOME CARE: Drink clear liquids.  This is very important! Dehydration (the lack of fluid) can lead to a serious complication.  Start off with 1 tablespoon every 5 minutes for 8 hours. You may begin eating bland foods after 8 hours without vomiting.  Start with saltine crackers, white bread, rice, mashed potatoes, applesauce. After 48 hours on a bland diet, you may resume a normal diet. Try to go to sleep.  Sleep often empties the stomach and relieves the need to vomit.  GET HELP RIGHT AWAY IF:  Your symptoms do not improve or worsen within 2 days after treatment. You have a fever for over 3 days. You cannot keep down fluids after trying the medication.  MAKE SURE YOU:  Understand these instructions. Will watch your condition. Will get help right away if you are not doing well or get worse.    Thank you for choosing an e-visit.  Your e-visit answers were reviewed by a board certified advanced clinical practitioner to complete your personal care plan. Depending upon the condition, your plan could have included both over the counter or prescription medications.  Please review your pharmacy choice. Make sure the pharmacy is open so  you can pick up prescription now. If there is a problem, you may contact your provider through MyChart messaging and have the prescription routed to another pharmacy.  Your safety is important to us. If you have drug allergies check your prescription carefully.   For the next 24 hours you can use MyChart to ask questions about today's visit, request a non-urgent call back, or ask for a work or school excuse. You will get an email in the next two days asking about your experience. I hope that your e-visit has been valuable and will speed your recovery.  5-10 minutes spent reviewing and documenting in chart.  

## 2021-01-22 ENCOUNTER — Other Ambulatory Visit (HOSPITAL_COMMUNITY): Payer: Self-pay

## 2021-02-06 ENCOUNTER — Other Ambulatory Visit (HOSPITAL_COMMUNITY): Payer: Self-pay

## 2021-02-09 ENCOUNTER — Other Ambulatory Visit (HOSPITAL_COMMUNITY): Payer: Self-pay

## 2021-02-10 ENCOUNTER — Other Ambulatory Visit (HOSPITAL_COMMUNITY): Payer: Self-pay

## 2021-02-10 MED ORDER — AMPHETAMINE-DEXTROAMPHETAMINE 10 MG PO TABS
ORAL_TABLET | ORAL | 0 refills | Status: DC
  Filled 2021-02-10: qty 30, 30d supply, fill #0

## 2021-02-10 MED ORDER — AMPHETAMINE-DEXTROAMPHET ER 20 MG PO CP24
ORAL_CAPSULE | ORAL | 0 refills | Status: DC
  Filled 2021-02-10: qty 60, 30d supply, fill #0

## 2021-02-16 ENCOUNTER — Other Ambulatory Visit (HOSPITAL_COMMUNITY): Payer: Self-pay

## 2021-02-17 ENCOUNTER — Other Ambulatory Visit (HOSPITAL_COMMUNITY): Payer: Self-pay

## 2021-03-02 ENCOUNTER — Other Ambulatory Visit (HOSPITAL_COMMUNITY): Payer: Self-pay

## 2021-03-05 ENCOUNTER — Other Ambulatory Visit (HOSPITAL_COMMUNITY): Payer: Self-pay

## 2021-03-10 ENCOUNTER — Other Ambulatory Visit (HOSPITAL_COMMUNITY): Payer: Self-pay

## 2021-03-10 MED ORDER — FLUOXETINE HCL 10 MG PO CAPS
ORAL_CAPSULE | ORAL | 1 refills | Status: DC
  Filled 2021-03-10: qty 90, 30d supply, fill #0

## 2021-03-12 ENCOUNTER — Other Ambulatory Visit (HOSPITAL_COMMUNITY): Payer: Self-pay

## 2021-03-20 ENCOUNTER — Other Ambulatory Visit (HOSPITAL_COMMUNITY): Payer: Self-pay

## 2021-03-20 MED ORDER — AMPHETAMINE-DEXTROAMPHETAMINE 10 MG PO TABS
ORAL_TABLET | ORAL | 0 refills | Status: DC
  Filled 2021-03-20: qty 30, 30d supply, fill #0

## 2021-03-20 MED ORDER — AMPHETAMINE-DEXTROAMPHET ER 20 MG PO CP24
ORAL_CAPSULE | ORAL | 0 refills | Status: DC
  Filled 2021-03-20: qty 60, 30d supply, fill #0

## 2021-04-13 ENCOUNTER — Other Ambulatory Visit (HOSPITAL_COMMUNITY): Payer: Self-pay

## 2021-04-15 ENCOUNTER — Other Ambulatory Visit (HOSPITAL_COMMUNITY): Payer: Self-pay

## 2021-04-15 DIAGNOSIS — Z79899 Other long term (current) drug therapy: Secondary | ICD-10-CM | POA: Diagnosis not present

## 2021-04-15 DIAGNOSIS — F902 Attention-deficit hyperactivity disorder, combined type: Secondary | ICD-10-CM | POA: Diagnosis not present

## 2021-04-15 MED ORDER — AMPHETAMINE-DEXTROAMPHETAMINE 10 MG PO TABS
ORAL_TABLET | ORAL | 0 refills | Status: DC
  Filled 2021-04-23: qty 30, 30d supply, fill #0

## 2021-04-15 MED ORDER — AMPHETAMINE-DEXTROAMPHET ER 20 MG PO CP24
40.0000 mg | ORAL_CAPSULE | Freq: Every day | ORAL | 0 refills | Status: DC
  Filled 2021-04-23: qty 60, 30d supply, fill #0

## 2021-04-15 MED ORDER — FLUOXETINE HCL 10 MG PO CAPS
ORAL_CAPSULE | ORAL | 1 refills | Status: DC
  Filled 2021-04-15 – 2021-04-23 (×2): qty 120, 30d supply, fill #0
  Filled 2021-05-22: qty 120, 30d supply, fill #1

## 2021-04-15 MED ORDER — HYDROXYZINE PAMOATE 25 MG PO CAPS
ORAL_CAPSULE | ORAL | 2 refills | Status: DC
  Filled 2021-04-15 – 2021-04-23 (×2): qty 60, 30d supply, fill #0
  Filled 2021-06-24: qty 60, 30d supply, fill #1
  Filled 2021-07-24: qty 60, 30d supply, fill #2

## 2021-04-23 ENCOUNTER — Other Ambulatory Visit (HOSPITAL_COMMUNITY): Payer: Self-pay

## 2021-05-22 ENCOUNTER — Other Ambulatory Visit (HOSPITAL_COMMUNITY): Payer: Self-pay

## 2021-05-22 MED ORDER — AMPHETAMINE-DEXTROAMPHETAMINE 10 MG PO TABS
ORAL_TABLET | ORAL | 0 refills | Status: DC
  Filled 2021-05-22: qty 30, 30d supply, fill #0

## 2021-05-22 MED ORDER — AMPHETAMINE-DEXTROAMPHET ER 20 MG PO CP24
ORAL_CAPSULE | ORAL | 0 refills | Status: DC
  Filled 2021-05-22: qty 60, 30d supply, fill #0

## 2021-06-24 ENCOUNTER — Other Ambulatory Visit (HOSPITAL_COMMUNITY): Payer: Self-pay

## 2021-06-26 ENCOUNTER — Other Ambulatory Visit (HOSPITAL_COMMUNITY): Payer: Self-pay

## 2021-06-26 MED ORDER — AMPHETAMINE-DEXTROAMPHETAMINE 10 MG PO TABS
ORAL_TABLET | ORAL | 0 refills | Status: DC
  Filled 2021-06-26: qty 30, 30d supply, fill #0

## 2021-06-26 MED ORDER — AMPHETAMINE-DEXTROAMPHET ER 20 MG PO CP24
40.0000 mg | ORAL_CAPSULE | Freq: Every day | ORAL | 0 refills | Status: DC
  Filled 2021-06-26: qty 60, 30d supply, fill #0

## 2021-06-26 MED ORDER — FLUOXETINE HCL 10 MG PO CAPS
40.0000 mg | ORAL_CAPSULE | Freq: Every day | ORAL | 6 refills | Status: DC
  Filled 2021-06-26: qty 120, 30d supply, fill #0
  Filled 2021-09-28: qty 120, 30d supply, fill #1
  Filled 2021-11-13: qty 120, 30d supply, fill #2
  Filled 2021-12-14: qty 120, 30d supply, fill #3
  Filled 2022-01-15: qty 120, 30d supply, fill #4
  Filled 2022-02-19: qty 120, 30d supply, fill #5
  Filled 2022-03-24: qty 120, 30d supply, fill #6

## 2021-07-15 ENCOUNTER — Other Ambulatory Visit (HOSPITAL_COMMUNITY): Payer: Self-pay

## 2021-07-15 DIAGNOSIS — Z79899 Other long term (current) drug therapy: Secondary | ICD-10-CM | POA: Diagnosis not present

## 2021-07-15 DIAGNOSIS — F902 Attention-deficit hyperactivity disorder, combined type: Secondary | ICD-10-CM | POA: Diagnosis not present

## 2021-07-15 MED ORDER — FLUOXETINE HCL 20 MG PO CAPS
ORAL_CAPSULE | ORAL | 11 refills | Status: DC
  Filled 2021-08-06: qty 60, 30d supply, fill #0
  Filled 2021-08-28: qty 60, 30d supply, fill #1

## 2021-07-20 ENCOUNTER — Other Ambulatory Visit (HOSPITAL_COMMUNITY): Payer: Self-pay

## 2021-07-24 ENCOUNTER — Other Ambulatory Visit (HOSPITAL_COMMUNITY): Payer: Self-pay

## 2021-07-25 ENCOUNTER — Other Ambulatory Visit (HOSPITAL_COMMUNITY): Payer: Self-pay

## 2021-07-25 MED ORDER — GUANFACINE HCL ER 2 MG PO TB24
2.0000 mg | ORAL_TABLET | Freq: Every day | ORAL | 5 refills | Status: DC
  Filled 2021-07-25: qty 30, 30d supply, fill #0
  Filled 2021-08-28: qty 30, 30d supply, fill #1
  Filled 2021-09-28: qty 30, 30d supply, fill #2

## 2021-08-06 ENCOUNTER — Other Ambulatory Visit (HOSPITAL_COMMUNITY): Payer: Self-pay

## 2021-08-07 ENCOUNTER — Other Ambulatory Visit (HOSPITAL_COMMUNITY): Payer: Self-pay

## 2021-08-07 MED ORDER — AMPHETAMINE-DEXTROAMPHETAMINE 10 MG PO TABS
ORAL_TABLET | ORAL | 0 refills | Status: DC
  Filled 2021-08-07: qty 30, 30d supply, fill #0

## 2021-08-07 MED ORDER — AMPHETAMINE-DEXTROAMPHET ER 20 MG PO CP24
40.0000 mg | ORAL_CAPSULE | Freq: Every day | ORAL | 0 refills | Status: DC
  Filled 2021-08-07: qty 60, 30d supply, fill #0

## 2021-08-28 ENCOUNTER — Other Ambulatory Visit (HOSPITAL_COMMUNITY): Payer: Self-pay

## 2021-08-31 ENCOUNTER — Other Ambulatory Visit (HOSPITAL_COMMUNITY): Payer: Self-pay

## 2021-09-28 ENCOUNTER — Other Ambulatory Visit (HOSPITAL_COMMUNITY): Payer: Self-pay

## 2021-09-29 ENCOUNTER — Other Ambulatory Visit (HOSPITAL_COMMUNITY): Payer: Self-pay

## 2021-09-29 MED ORDER — AMPHETAMINE-DEXTROAMPHETAMINE 10 MG PO TABS
ORAL_TABLET | ORAL | 0 refills | Status: DC
  Filled 2021-09-29: qty 30, 30d supply, fill #0

## 2021-09-29 MED ORDER — HYDROXYZINE PAMOATE 25 MG PO CAPS
ORAL_CAPSULE | ORAL | 2 refills | Status: DC
  Filled 2021-09-29: qty 60, 30d supply, fill #0
  Filled 2021-11-13: qty 60, 30d supply, fill #1
  Filled 2021-12-14: qty 60, 30d supply, fill #2

## 2021-09-29 MED ORDER — AMPHETAMINE-DEXTROAMPHET ER 20 MG PO CP24
ORAL_CAPSULE | ORAL | 0 refills | Status: DC
  Filled 2021-09-29: qty 60, 30d supply, fill #0

## 2021-10-07 ENCOUNTER — Ambulatory Visit (HOSPITAL_BASED_OUTPATIENT_CLINIC_OR_DEPARTMENT_OTHER): Payer: 59 | Admitting: Family Medicine

## 2021-10-07 ENCOUNTER — Encounter (HOSPITAL_BASED_OUTPATIENT_CLINIC_OR_DEPARTMENT_OTHER): Payer: Self-pay

## 2021-10-21 ENCOUNTER — Other Ambulatory Visit (HOSPITAL_COMMUNITY): Payer: Self-pay

## 2021-10-21 DIAGNOSIS — F902 Attention-deficit hyperactivity disorder, combined type: Secondary | ICD-10-CM | POA: Diagnosis not present

## 2021-10-21 DIAGNOSIS — Z79899 Other long term (current) drug therapy: Secondary | ICD-10-CM | POA: Diagnosis not present

## 2021-10-21 MED ORDER — CLONIDINE HCL 0.1 MG PO TABS
ORAL_TABLET | ORAL | 2 refills | Status: AC
  Filled 2021-10-21: qty 60, 30d supply, fill #0
  Filled 2021-11-13: qty 60, 30d supply, fill #1
  Filled 2021-12-14: qty 60, 30d supply, fill #2

## 2021-10-21 MED ORDER — AMPHETAMINE-DEXTROAMPHET ER 20 MG PO CP24
ORAL_CAPSULE | ORAL | 0 refills | Status: DC
  Filled 2021-11-13: qty 60, 30d supply, fill #0

## 2021-10-21 MED ORDER — AMPHETAMINE-DEXTROAMPHETAMINE 10 MG PO TABS
ORAL_TABLET | ORAL | 0 refills | Status: AC
  Filled 2021-11-13: qty 30, 30d supply, fill #0

## 2021-11-13 ENCOUNTER — Other Ambulatory Visit (HOSPITAL_COMMUNITY): Payer: Self-pay

## 2021-11-19 ENCOUNTER — Other Ambulatory Visit (HOSPITAL_COMMUNITY): Payer: Self-pay

## 2021-11-25 ENCOUNTER — Ambulatory Visit (HOSPITAL_BASED_OUTPATIENT_CLINIC_OR_DEPARTMENT_OTHER): Payer: 59 | Admitting: Family Medicine

## 2021-11-25 ENCOUNTER — Encounter (HOSPITAL_BASED_OUTPATIENT_CLINIC_OR_DEPARTMENT_OTHER): Payer: Self-pay | Admitting: Family Medicine

## 2021-11-25 DIAGNOSIS — Z0000 Encounter for general adult medical examination without abnormal findings: Secondary | ICD-10-CM

## 2021-11-25 DIAGNOSIS — K219 Gastro-esophageal reflux disease without esophagitis: Secondary | ICD-10-CM

## 2021-11-25 DIAGNOSIS — F909 Attention-deficit hyperactivity disorder, unspecified type: Secondary | ICD-10-CM | POA: Diagnosis not present

## 2021-11-25 NOTE — Patient Instructions (Signed)
  Medication Instructions:  Your physician recommends that you continue on your current medications as directed. Please refer to the Current Medication list given to you today. --If you need a refill on any your medications before your next appointment, please call your pharmacy first. If no refills are authorized on file call the office.-- Lab Work: Your physician has recommended that you have lab work today: No If you have labs (blood work) drawn today and your tests are completely normal, you will receive your results via MyChart message OR a phone call from our staff.  Please ensure you check your voicemail in the event that you authorized detailed messages to be left on a delegated number. If you have any lab test that is abnormal or we need to change your treatment, we will call you to review the results.  Referrals/Procedures/Imaging: No  Follow-Up: Your next appointment:   Your physician recommends that you schedule a follow-up appointment in: 1-2 months cpe with Dr. de [place]. Nurse visit 1 week before for labs.   You will receive a text message or e-mail with a link to a survey about your care and experience with us today! We would greatly appreciate your feedback!   Thanks for letting us be apart of your health journey!!  Primary Care and Sports Medicine   Dr. Raymond de Cuba   We encourage you to activate your patient portal called "MyChart".  Sign up information is provided on this After Visit Summary.  MyChart is used to connect with patients for Virtual Visits (Telemedicine).  Patients are able to view lab/test results, encounter notes, upcoming appointments, etc.  Non-urgent messages can be sent to your provider as well. To learn more about what you can do with MyChart, please visit --  [email or web address].    

## 2021-11-25 NOTE — Assessment & Plan Note (Signed)
Recommend continue to follow with specialist, continue with current medication regimen.  He thinks that they had wanted certain labs to be completed, he will check with their office and let us know which labs specifically may be requested

## 2021-11-25 NOTE — Progress Notes (Signed)
New Patient Office Visit  Subjective    Patient ID: Gordon Coleman, male    DOB: Oct 14, 1995  Age: 26 y.o. MRN: [id number]  CC:  Chief Complaint  Patient presents with   New Patient (Initial Visit)    Pt here to establish new care     HPI Gordon Coleman presents to establish care Last PCP - provider [facility], has been awhile since last visit  ADHD: Currently following with provider at [facility]. They recommended that he establish with PCP for monitoring of labs, completing health maintenance recommendations.  GERD: Patient currently utilizing Nexium as well as Pepcid, we will use Pepcid intermittently.  Has had prior GI evaluation, however has been a few years since this.  Recalls being diagnosed with a hiatal hernia at 1 point.  Patient is originally from [place], grew up in [place]. Patient is working for [employer] as a Psychologist, sport and exercise. Outside of work, he enjoys video games, reading.  Outpatient Encounter Medications as of 11/25/2021  Medication Sig   amphetamine-dextroamphetamine (ADDERALL XR) 20 MG 24 hr capsule Take 2 capsule by mouth daily   amphetamine-dextroamphetamine (ADDERALL) 10 MG tablet Take 1/2 to 1 tablet by mouth daily in the afternoon as needed.   cloNIDine (CATAPRES) 0.1 MG tablet Take 1 tablet by mouth twice a day as needed for anxiety/sweating   esomeprazole (NEXIUM) 40 MG capsule Take 1 capsule (40 mg total) by mouth daily.   famotidine (PEPCID) 20 MG tablet Take 1 tab just before lunch and 1 tab just before dinner.   hydrOXYzine (VISTARIL) 25 MG capsule Take 1 to 2 capsules by mouth daily   [DISCONTINUED] FLUoxetine (PROZAC) 10 MG capsule Take 3 capsules by mouth daily.   FLUoxetine (PROZAC) 10 MG capsule Take 4 capsules by mouth daily.   [DISCONTINUED] amphetamine-dextroamphetamine (ADDERALL XR) 20 MG 24 hr capsule TAKE 2 CAPSULES BY MOUTH ONCE A DAY   [DISCONTINUED] amphetamine-dextroamphetamine (ADDERALL XR) 20 MG  24 hr capsule TAKE 2 CAPSULES BY MOUTH ONCE A DAY   [DISCONTINUED] amphetamine-dextroamphetamine (ADDERALL XR) 20 MG 24 hr capsule TAKE 2 CAPSULES BY MOUTH ONCE A DAY   [DISCONTINUED] amphetamine-dextroamphetamine (ADDERALL XR) 20 MG 24 hr capsule TAKE 2 CAPSULES BY MOUTH ONCE A DAY   [DISCONTINUED] amphetamine-dextroamphetamine (ADDERALL) 10 MG tablet    [DISCONTINUED] amphetamine-dextroamphetamine (ADDERALL) 10 MG tablet TAKE 1/2 TO 1 TABLET BY MOUTH IN THE AFTERNOON AS NEEDED   [DISCONTINUED] amphetamine-dextroamphetamine (ADDERALL) 10 MG tablet TAKE 1/2 TO 1 TABLET BY MOUTH ONCE A DAY IN THE AFTERNOON AS NEEDED   [DISCONTINUED] amphetamine-dextroamphetamine (ADDERALL) 10 MG tablet TAKE 1/2-1 TABLET BY MOUTH ONCE A DAY IN THE AFTERNOON AS NEEDED   [DISCONTINUED] azithromycin (ZITHROMAX) 250 MG tablet Take 500 mg once, then 250 mg for four days (Patient not taking: Reported on 09/12/2017)   [DISCONTINUED] beclomethasone (QVAR) 80 MCG/ACT inhaler Inhale 1 puff into the lungs as needed. (Patient not taking: Reported on 09/12/2017)   [DISCONTINUED] budesonide-formoterol (SYMBICORT) 160-4.5 MCG/ACT inhaler Inhale 2 puffs into the lungs 2 (two) times daily. (Patient not taking: Reported on 09/12/2017)   [DISCONTINUED] chlorpheniramine-HYDROcodone (TUSSIONEX) 10-8 MG/5ML LQCR Take 5 mLs by mouth every 12 (twelve) hours as needed. (Patient not taking: Reported on 09/12/2017)   [DISCONTINUED] FLUoxetine (PROZAC) 10 MG capsule TAKE 3 CAPSULES BY MOUTH DAILY   [DISCONTINUED] FLUoxetine (PROZAC) 20 MG capsule Take 2 capsules by mouth once a day   [DISCONTINUED] guanFACINE (INTUNIV) 2 MG TB24 ER tablet TAKE 1 TABLET BY  MOUTH DAILY   [DISCONTINUED] guanFACINE (INTUNIV) 2 MG TB24 ER tablet Take 1 tablet (2 mg total) by mouth daily.   [DISCONTINUED] ondansetron (ZOFRAN ODT) 4 MG disintegrating tablet Take 1 tablet (4 mg total) by mouth every 8 (eight) hours as needed for nausea or vomiting. (Patient not taking: Reported  on 09/12/2017)   [DISCONTINUED] ondansetron (ZOFRAN) 4 MG tablet Take 1 tablet (4 mg total) by mouth every 8 (eight) hours as needed for nausea or vomiting.   [DISCONTINUED] pantoprazole (PROTONIX) 40 MG tablet Take 1 tablet (40 mg total) by mouth daily. Take on an empty stomach, 30 minutes before the first meal of the day.   [DISCONTINUED] predniSONE (STERAPRED UNI-PAK 21 TAB) 10 MG (21) TBPK tablet Use as directed (Patient not taking: Reported on 09/12/2017)   [DISCONTINUED] ranitidine (ZANTAC) 150 MG capsule Take 1 capsule (150 mg total) by mouth 2 (two) times daily.   [DISCONTINUED] traMADol (ULTRAM) 50 MG tablet Take 2 tablets (100 mg total) by mouth every 8 (eight) hours as needed for pain. (Patient not taking: Reported on 09/12/2017)   [DISCONTINUED] VYVANSE 60 MG capsule    No facility-administered encounter medications on file as of 11/25/2021.    History reviewed. No pertinent past medical history.  History reviewed. No pertinent surgical history.  Family History  Problem Relation Age of Onset   Arrhythmia Mother    Heart attack Maternal Uncle        several suffered from heart attacks    Social History   Socioeconomic History   Marital status: Single    Spouse name: Not on file   Number of children: Not on file   Years of education: Not on file   Highest education level: Not on file  Occupational History   Not on file  Tobacco Use   Smoking status: Never   Smokeless tobacco: Never  Substance and Sexual Activity   Alcohol use: No   Drug use: No   Sexual activity: Not on file  Other Topics Concern   Not on file  Social History Narrative   Not on file   Social Determinants of Health   Financial Resource Strain: Not on file  Food Insecurity: Not on file  Transportation Needs: Not on file  Physical Activity: Not on file  Stress: Not on file  Social Connections: Not on file  Intimate Partner Violence: Not on file    Objective    BP 129/82   Pulse (!) 108   Ht  5\' 6"  (1.676 m)   Wt 202 lb 4.8 oz (91.8 kg)   SpO2 99%   BMI 32.65 kg/m   Physical Exam  26 year old male in no acute distress Cardiovascular Sam with regular rate and rhythm, no murmur appreciated Lungs clear to auscultation bilaterally Abdomen with normal bowel sounds, soft, nontender, nondistended, no organomegaly  Assessment & Plan:   Problem List Items Addressed This Visit       Digestive   GERD (gastroesophageal reflux disease)    Given chronicity of symptoms, continued need for PPI plus intermittent H2 blocker, will refer to gastroenterology for further evaluation and recommendations regarding medication management or if they feel that any testing or procedures will be warranted to evaluate further      Relevant Orders   Ambulatory referral to Gastroenterology     Other   ADHD    Recommend continue to follow with specialist, continue with current medication regimen.  He thinks that they had wanted certain labs to be completed,  he will check with their office and let us know which labs specifically may be requested      Other Visit Diagnoses     Wellness examination       Relevant Orders   CBC with Differential/Platelet   Comprehensive metabolic panel   Hemoglobin A1c   Lipid panel   TSH Rfx on Abnormal to Free T4       Return in about 6 weeks (around 01/06/2022) for CPE with [facility] a few days prior.   Leatta Alewine J De [place], MD

## 2021-11-25 NOTE — Assessment & Plan Note (Signed)
Given chronicity of symptoms, continued need for PPI plus intermittent H2 blocker, will refer to gastroenterology for further evaluation and recommendations regarding medication management or if they feel that any testing or procedures will be warranted to evaluate further

## 2021-11-30 ENCOUNTER — Telehealth: Payer: 59 | Admitting: Physician Assistant

## 2021-11-30 DIAGNOSIS — S3992XA Unspecified injury of lower back, initial encounter: Secondary | ICD-10-CM

## 2021-11-30 NOTE — Progress Notes (Signed)
Because symptoms started after heavy lifting and injury and are persistent, I feel your condition warrants further evaluation and I recommend that you be seen in a face to face visit.   NOTE: There will be NO CHARGE for this eVisit   If you are having a true medical emergency please call 911.      For an urgent face to face visit, [facility] has seven urgent care centers for your convenience:     [facility] [place] [facility] [place] Get Driving Directions [phone] [address] [place], [place] [address]    [facility] [place] [facility]) Get Driving Directions [phone] [place], [place] [address]  [facility] [place] ([place]) Get Driving Directions [phone] [address] [place],  [place]  [address]  [facility] [place] [facility] [address] Get Driving Directions  [phone] [place],  [place] [address]   [facility] [place] Get Driving Directions [phone] [address], [place], [place] [address]   [facility] Get Driving Directions  [phone] [address] [place], [place] [address]   [facility] [place] Get Driving Directions [phone] [address] [place], [place] [address]  Your MyChart E-visit questionnaire answers were reviewed by a board certified advanced clinical practitioner to complete your personal care plan based on your specific symptoms.  Thank you for using e-Visits.

## 2021-12-14 ENCOUNTER — Other Ambulatory Visit (HOSPITAL_COMMUNITY): Payer: Self-pay

## 2022-01-15 ENCOUNTER — Other Ambulatory Visit (HOSPITAL_COMMUNITY): Payer: Self-pay

## 2022-01-15 MED ORDER — AMPHETAMINE-DEXTROAMPHETAMINE 10 MG PO TABS
5.0000 mg | ORAL_TABLET | Freq: Every day | ORAL | 0 refills | Status: AC | PRN
  Filled 2022-01-15: qty 30, 30d supply, fill #0

## 2022-01-15 MED ORDER — CLONIDINE HCL 0.1 MG PO TABS
0.1000 mg | ORAL_TABLET | Freq: Two times a day (BID) | ORAL | 2 refills | Status: AC | PRN
  Filled 2022-01-15: qty 60, 30d supply, fill #0

## 2022-01-15 MED ORDER — AMPHETAMINE-DEXTROAMPHET ER 20 MG PO CP24
40.0000 mg | ORAL_CAPSULE | Freq: Every day | ORAL | 0 refills | Status: DC
  Filled 2022-01-15: qty 60, 30d supply, fill #0

## 2022-01-20 ENCOUNTER — Other Ambulatory Visit (HOSPITAL_COMMUNITY): Payer: Self-pay

## 2022-01-20 DIAGNOSIS — F902 Attention-deficit hyperactivity disorder, combined type: Secondary | ICD-10-CM | POA: Diagnosis not present

## 2022-01-20 DIAGNOSIS — Z79899 Other long term (current) drug therapy: Secondary | ICD-10-CM | POA: Diagnosis not present

## 2022-01-20 MED ORDER — CLONIDINE HCL ER 0.1 MG PO TB12
0.2000 mg | ORAL_TABLET | Freq: Every day | ORAL | 3 refills | Status: DC
  Filled 2022-01-20: qty 60, 30d supply, fill #0
  Filled 2022-02-19: qty 60, 30d supply, fill #1
  Filled 2022-03-24: qty 60, 30d supply, fill #2
  Filled 2022-05-19: qty 60, 30d supply, fill #3

## 2022-01-20 MED ORDER — HYDROXYZINE PAMOATE 25 MG PO CAPS
25.0000 mg | ORAL_CAPSULE | Freq: Every day | ORAL | 2 refills | Status: AC
  Filled 2022-01-20: qty 60, 30d supply, fill #0
  Filled 2022-02-19: qty 60, 30d supply, fill #1
  Filled 2022-03-24: qty 60, 30d supply, fill #2

## 2022-01-22 ENCOUNTER — Other Ambulatory Visit (HOSPITAL_COMMUNITY): Payer: Self-pay

## 2022-01-28 DIAGNOSIS — F902 Attention-deficit hyperactivity disorder, combined type: Secondary | ICD-10-CM | POA: Diagnosis not present

## 2022-02-19 ENCOUNTER — Other Ambulatory Visit (HOSPITAL_COMMUNITY): Payer: Self-pay

## 2022-03-03 DIAGNOSIS — H5213 Myopia, bilateral: Secondary | ICD-10-CM | POA: Diagnosis not present

## 2022-03-03 DIAGNOSIS — H52223 Regular astigmatism, bilateral: Secondary | ICD-10-CM | POA: Diagnosis not present

## 2022-03-25 ENCOUNTER — Other Ambulatory Visit (HOSPITAL_COMMUNITY): Payer: Self-pay

## 2022-03-25 MED ORDER — AMPHETAMINE-DEXTROAMPHETAMINE 10 MG PO TABS
5.0000 mg | ORAL_TABLET | Freq: Every day | ORAL | 0 refills | Status: DC
  Filled 2022-03-25: qty 30, 30d supply, fill #0

## 2022-03-25 MED ORDER — AMPHETAMINE-DEXTROAMPHET ER 20 MG PO CP24
40.0000 mg | ORAL_CAPSULE | Freq: Every day | ORAL | 0 refills | Status: DC
  Filled 2022-03-25: qty 60, 30d supply, fill #0

## 2022-04-28 DIAGNOSIS — Z79899 Other long term (current) drug therapy: Secondary | ICD-10-CM | POA: Diagnosis not present

## 2022-04-28 DIAGNOSIS — F902 Attention-deficit hyperactivity disorder, combined type: Secondary | ICD-10-CM | POA: Diagnosis not present

## 2022-05-19 ENCOUNTER — Other Ambulatory Visit (HOSPITAL_COMMUNITY): Payer: Self-pay

## 2022-05-19 MED ORDER — AMPHETAMINE-DEXTROAMPHET ER 20 MG PO CP24
40.0000 mg | ORAL_CAPSULE | Freq: Every day | ORAL | 0 refills | Status: DC
  Filled 2022-05-19: qty 60, 30d supply, fill #0

## 2022-05-19 MED ORDER — AMPHETAMINE-DEXTROAMPHETAMINE 10 MG PO TABS
5.0000 mg | ORAL_TABLET | Freq: Every day | ORAL | 0 refills | Status: DC
  Filled 2022-05-19: qty 30, 30d supply, fill #0

## 2022-05-19 MED ORDER — FLUOXETINE HCL 10 MG PO CAPS
30.0000 mg | ORAL_CAPSULE | Freq: Every day | ORAL | 6 refills | Status: DC
  Filled 2022-05-19: qty 90, 30d supply, fill #0
  Filled 2022-06-24: qty 90, 30d supply, fill #1
  Filled 2022-07-22: qty 90, 30d supply, fill #2
  Filled 2022-08-20: qty 90, 30d supply, fill #3
  Filled 2022-09-24: qty 90, 30d supply, fill #4
  Filled 2022-10-29: qty 90, 30d supply, fill #5
  Filled 2022-11-26: qty 90, 30d supply, fill #6

## 2022-05-20 ENCOUNTER — Other Ambulatory Visit (HOSPITAL_COMMUNITY): Payer: Self-pay

## 2022-06-25 ENCOUNTER — Other Ambulatory Visit (HOSPITAL_COMMUNITY): Payer: Self-pay

## 2022-06-25 MED ORDER — AMPHETAMINE-DEXTROAMPHET ER 20 MG PO CP24
40.0000 mg | ORAL_CAPSULE | Freq: Every day | ORAL | 0 refills | Status: DC
  Filled 2022-06-25: qty 60, 30d supply, fill #0

## 2022-06-25 MED ORDER — HYDROXYZINE PAMOATE 25 MG PO CAPS
25.0000 mg | ORAL_CAPSULE | Freq: Every day | ORAL | 2 refills | Status: DC
  Filled 2022-06-25: qty 60, 30d supply, fill #0
  Filled 2022-07-22: qty 60, 30d supply, fill #1
  Filled 2022-08-20: qty 60, 30d supply, fill #2

## 2022-06-25 MED ORDER — CLONIDINE HCL ER 0.1 MG PO TB12
0.2000 mg | ORAL_TABLET | Freq: Every day | ORAL | 3 refills | Status: DC
  Filled 2022-06-25: qty 60, 30d supply, fill #0
  Filled 2022-07-22: qty 60, 30d supply, fill #1
  Filled 2022-08-20: qty 60, 30d supply, fill #2
  Filled 2022-09-24: qty 60, 30d supply, fill #3

## 2022-06-25 MED ORDER — AMPHETAMINE-DEXTROAMPHETAMINE 10 MG PO TABS
5.0000 mg | ORAL_TABLET | Freq: Every day | ORAL | 0 refills | Status: DC
  Filled 2022-06-25: qty 30, 30d supply, fill #0

## 2022-07-27 DIAGNOSIS — Z79899 Other long term (current) drug therapy: Secondary | ICD-10-CM | POA: Diagnosis not present

## 2022-07-27 DIAGNOSIS — F902 Attention-deficit hyperactivity disorder, combined type: Secondary | ICD-10-CM | POA: Diagnosis not present

## 2022-08-27 ENCOUNTER — Other Ambulatory Visit (HOSPITAL_COMMUNITY): Payer: Self-pay

## 2022-08-27 MED ORDER — AMPHETAMINE-DEXTROAMPHETAMINE 10 MG PO TABS
5.0000 mg | ORAL_TABLET | Freq: Every day | ORAL | 0 refills | Status: DC
  Filled 2022-08-27: qty 30, 30d supply, fill #0

## 2022-08-27 MED ORDER — AMPHETAMINE-DEXTROAMPHET ER 20 MG PO CP24
40.0000 mg | ORAL_CAPSULE | Freq: Every day | ORAL | 0 refills | Status: DC
  Filled 2022-08-27: qty 60, 30d supply, fill #0

## 2022-09-24 ENCOUNTER — Other Ambulatory Visit (HOSPITAL_COMMUNITY): Payer: Self-pay

## 2022-09-24 MED ORDER — AMPHETAMINE-DEXTROAMPHETAMINE 10 MG PO TABS
ORAL_TABLET | ORAL | 0 refills | Status: DC
  Filled 2022-09-24: qty 30, 30d supply, fill #0

## 2022-09-24 MED ORDER — AMPHETAMINE-DEXTROAMPHET ER 20 MG PO CP24
ORAL_CAPSULE | ORAL | 0 refills | Status: DC
  Filled 2022-09-24: qty 60, 30d supply, fill #0

## 2022-09-29 ENCOUNTER — Other Ambulatory Visit (HOSPITAL_COMMUNITY): Payer: Self-pay

## 2022-10-29 ENCOUNTER — Other Ambulatory Visit (HOSPITAL_COMMUNITY): Payer: Self-pay

## 2022-10-29 ENCOUNTER — Other Ambulatory Visit: Payer: Self-pay

## 2022-10-29 MED ORDER — CLONIDINE HCL ER 0.1 MG PO TB12
0.2000 mg | ORAL_TABLET | Freq: Every day | ORAL | 11 refills | Status: AC
  Filled 2022-10-29: qty 60, 30d supply, fill #0
  Filled 2022-11-26: qty 60, 30d supply, fill #1
  Filled 2022-12-24: qty 60, 30d supply, fill #2
  Filled 2023-01-25: qty 60, 30d supply, fill #3
  Filled 2023-02-21: qty 60, 30d supply, fill #4
  Filled 2023-03-24: qty 60, 30d supply, fill #5
  Filled 2023-04-28: qty 60, 30d supply, fill #6
  Filled 2023-05-26 – 2023-05-27 (×2): qty 60, 30d supply, fill #7
  Filled 2023-06-23: qty 60, 30d supply, fill #8

## 2022-10-29 MED ORDER — HYDROXYZINE PAMOATE 25 MG PO CAPS
25.0000 mg | ORAL_CAPSULE | Freq: Every day | ORAL | 2 refills | Status: DC
  Filled 2022-10-29: qty 60, 30d supply, fill #0
  Filled 2022-11-26: qty 60, 30d supply, fill #1
  Filled 2022-12-24: qty 60, 30d supply, fill #2

## 2022-10-29 MED ORDER — AMPHETAMINE-DEXTROAMPHET ER 20 MG PO CP24
40.0000 mg | ORAL_CAPSULE | Freq: Every day | ORAL | 0 refills | Status: DC
  Filled 2022-10-29: qty 60, 30d supply, fill #0

## 2022-10-29 MED ORDER — AMPHETAMINE-DEXTROAMPHETAMINE 10 MG PO TABS
5.0000 mg | ORAL_TABLET | Freq: Every day | ORAL | 0 refills | Status: DC
  Filled 2022-10-29: qty 30, 30d supply, fill #0

## 2022-11-29 ENCOUNTER — Other Ambulatory Visit (HOSPITAL_COMMUNITY): Payer: Self-pay

## 2022-11-30 DIAGNOSIS — F902 Attention-deficit hyperactivity disorder, combined type: Secondary | ICD-10-CM | POA: Diagnosis not present

## 2022-11-30 DIAGNOSIS — Z79899 Other long term (current) drug therapy: Secondary | ICD-10-CM | POA: Diagnosis not present

## 2022-12-24 ENCOUNTER — Other Ambulatory Visit (HOSPITAL_COMMUNITY): Payer: Self-pay

## 2022-12-24 MED ORDER — AMPHETAMINE-DEXTROAMPHET ER 20 MG PO CP24
40.0000 mg | ORAL_CAPSULE | Freq: Every day | ORAL | 0 refills | Status: DC
  Filled 2022-12-24: qty 60, 30d supply, fill #0

## 2022-12-24 MED ORDER — AMPHETAMINE-DEXTROAMPHETAMINE 10 MG PO TABS
5.0000 mg | ORAL_TABLET | Freq: Every day | ORAL | 0 refills | Status: DC
  Filled 2022-12-24: qty 30, 30d supply, fill #0

## 2022-12-24 MED ORDER — FLUOXETINE HCL 10 MG PO CAPS
30.0000 mg | ORAL_CAPSULE | Freq: Every day | ORAL | 6 refills | Status: AC
  Filled 2022-12-24: qty 90, 30d supply, fill #0
  Filled 2023-01-25: qty 90, 30d supply, fill #1
  Filled 2023-02-21: qty 90, 30d supply, fill #2
  Filled 2023-03-24: qty 90, 30d supply, fill #3
  Filled 2023-04-21: qty 90, 30d supply, fill #4
  Filled 2023-05-26 – 2023-05-27 (×2): qty 90, 30d supply, fill #5
  Filled 2023-06-23: qty 90, 30d supply, fill #6
  Filled 2023-08-05: qty 15, 5d supply, fill #7

## 2022-12-27 ENCOUNTER — Other Ambulatory Visit (HOSPITAL_COMMUNITY): Payer: Self-pay

## 2022-12-28 ENCOUNTER — Other Ambulatory Visit (HOSPITAL_COMMUNITY): Payer: Self-pay

## 2022-12-31 ENCOUNTER — Other Ambulatory Visit (HOSPITAL_COMMUNITY): Payer: Self-pay

## 2023-01-13 ENCOUNTER — Other Ambulatory Visit (HOSPITAL_BASED_OUTPATIENT_CLINIC_OR_DEPARTMENT_OTHER): Payer: Self-pay

## 2023-01-13 MED ORDER — INFLUENZA VIRUS VACC SPLIT PF (FLUZONE) 0.5 ML IM SUSY
0.5000 mL | PREFILLED_SYRINGE | Freq: Once | INTRAMUSCULAR | 0 refills | Status: AC
  Filled 2023-01-13: qty 0.5, 1d supply, fill #0

## 2023-01-27 ENCOUNTER — Other Ambulatory Visit (HOSPITAL_COMMUNITY): Payer: Self-pay

## 2023-01-27 MED ORDER — HYDROXYZINE PAMOATE 25 MG PO CAPS
25.0000 mg | ORAL_CAPSULE | Freq: Every day | ORAL | 2 refills | Status: DC
  Filled 2023-01-27: qty 60, 30d supply, fill #0
  Filled 2023-02-21: qty 60, 30d supply, fill #1
  Filled 2023-03-24: qty 60, 30d supply, fill #2

## 2023-01-27 MED ORDER — AMPHETAMINE-DEXTROAMPHET ER 20 MG PO CP24
40.0000 mg | ORAL_CAPSULE | Freq: Every day | ORAL | 0 refills | Status: DC
Start: 2023-01-27 — End: 2023-04-01
  Filled 2023-02-21: qty 60, 30d supply, fill #0

## 2023-01-27 MED ORDER — AMPHETAMINE-DEXTROAMPHETAMINE 10 MG PO TABS
5.0000 mg | ORAL_TABLET | Freq: Every day | ORAL | 0 refills | Status: DC
Start: 2023-01-27 — End: 2023-04-01
  Filled 2023-01-27: qty 30, 30d supply, fill #0

## 2023-01-28 ENCOUNTER — Other Ambulatory Visit (HOSPITAL_COMMUNITY): Payer: Self-pay

## 2023-02-22 ENCOUNTER — Other Ambulatory Visit (HOSPITAL_COMMUNITY): Payer: Self-pay

## 2023-02-22 ENCOUNTER — Other Ambulatory Visit: Payer: Self-pay

## 2023-03-14 ENCOUNTER — Telehealth: Payer: Commercial Managed Care - PPO | Admitting: Physician Assistant

## 2023-03-14 DIAGNOSIS — A084 Viral intestinal infection, unspecified: Secondary | ICD-10-CM | POA: Diagnosis not present

## 2023-03-14 MED ORDER — ONDANSETRON 4 MG PO TBDP: 4.0000 mg | ORAL_TABLET | Freq: Three times a day (TID) | ORAL | 0 refills | Status: AC | PRN

## 2023-03-14 NOTE — Progress Notes (Signed)

## 2023-04-01 ENCOUNTER — Other Ambulatory Visit (HOSPITAL_COMMUNITY): Payer: Self-pay

## 2023-04-01 MED ORDER — AMPHETAMINE-DEXTROAMPHET ER 20 MG PO CP24
40.0000 mg | ORAL_CAPSULE | Freq: Every day | ORAL | 0 refills | Status: DC
  Filled 2023-04-01: qty 60, 30d supply, fill #0

## 2023-04-01 MED ORDER — AMPHETAMINE-DEXTROAMPHETAMINE 10 MG PO TABS
ORAL_TABLET | ORAL | 0 refills | Status: DC
  Filled 2023-04-01: qty 30, 30d supply, fill #0

## 2023-04-28 ENCOUNTER — Other Ambulatory Visit (HOSPITAL_COMMUNITY): Payer: Self-pay

## 2023-04-28 ENCOUNTER — Other Ambulatory Visit: Payer: Self-pay

## 2023-04-28 MED ORDER — AMPHETAMINE-DEXTROAMPHETAMINE 10 MG PO TABS
5.0000 mg | ORAL_TABLET | Freq: Every day | ORAL | 0 refills | Status: DC
  Filled 2023-04-29: qty 30, 30d supply, fill #0

## 2023-04-28 MED ORDER — AMPHETAMINE-DEXTROAMPHET ER 20 MG PO CP24
40.0000 mg | ORAL_CAPSULE | Freq: Every day | ORAL | 0 refills | Status: DC
  Filled 2023-05-05: qty 60, 30d supply, fill #0

## 2023-04-29 ENCOUNTER — Other Ambulatory Visit (HOSPITAL_COMMUNITY): Payer: Self-pay

## 2023-05-03 ENCOUNTER — Other Ambulatory Visit (HOSPITAL_COMMUNITY): Payer: Self-pay

## 2023-05-03 ENCOUNTER — Other Ambulatory Visit (HOSPITAL_BASED_OUTPATIENT_CLINIC_OR_DEPARTMENT_OTHER): Payer: Self-pay

## 2023-05-03 MED ORDER — HYDROXYZINE PAMOATE 25 MG PO CAPS
25.0000 mg | ORAL_CAPSULE | Freq: Every day | ORAL | 4 refills | Status: DC
Start: 2023-05-03 — End: 2023-10-14
  Filled 2023-05-03: qty 60, 30d supply, fill #0
  Filled 2023-05-26 – 2023-05-27 (×2): qty 60, 30d supply, fill #1
  Filled 2023-07-08: qty 60, 30d supply, fill #2
  Filled 2023-08-05: qty 60, 30d supply, fill #3
  Filled 2023-09-02: qty 60, 30d supply, fill #4

## 2023-05-05 ENCOUNTER — Other Ambulatory Visit: Payer: Self-pay

## 2023-05-05 ENCOUNTER — Other Ambulatory Visit (HOSPITAL_COMMUNITY): Payer: Self-pay

## 2023-05-27 ENCOUNTER — Other Ambulatory Visit: Payer: Self-pay

## 2023-05-27 ENCOUNTER — Other Ambulatory Visit (HOSPITAL_COMMUNITY): Payer: Self-pay

## 2023-06-08 ENCOUNTER — Other Ambulatory Visit (HOSPITAL_COMMUNITY): Payer: Self-pay

## 2023-06-08 DIAGNOSIS — Z79899 Other long term (current) drug therapy: Secondary | ICD-10-CM | POA: Diagnosis not present

## 2023-06-08 DIAGNOSIS — F902 Attention-deficit hyperactivity disorder, combined type: Secondary | ICD-10-CM | POA: Diagnosis not present

## 2023-06-08 DIAGNOSIS — F419 Anxiety disorder, unspecified: Secondary | ICD-10-CM | POA: Diagnosis not present

## 2023-06-08 MED ORDER — AMPHETAMINE-DEXTROAMPHETAMINE 10 MG PO TABS
5.0000 mg | ORAL_TABLET | Freq: Every day | ORAL | 0 refills | Status: DC
  Filled 2023-06-08: qty 30, 30d supply, fill #0

## 2023-07-08 ENCOUNTER — Other Ambulatory Visit (HOSPITAL_COMMUNITY): Payer: Self-pay

## 2023-07-08 MED ORDER — CLONIDINE HCL ER 0.1 MG PO TB12
0.2000 mg | ORAL_TABLET | Freq: Two times a day (BID) | ORAL | 11 refills | Status: AC
  Filled 2023-07-12 – 2023-07-13 (×2): qty 120, 30d supply, fill #0
  Filled 2023-08-12: qty 120, 30d supply, fill #1
  Filled 2023-09-14: qty 120, 30d supply, fill #2
  Filled 2023-10-13: qty 120, 30d supply, fill #3
  Filled 2023-11-18: qty 120, 30d supply, fill #4
  Filled 2023-12-29: qty 120, 30d supply, fill #5
  Filled 2024-01-31: qty 120, 30d supply, fill #6
  Filled 2024-03-13: qty 120, 30d supply, fill #7
  Filled 2024-04-12: qty 120, 30d supply, fill #8

## 2023-07-11 ENCOUNTER — Other Ambulatory Visit (HOSPITAL_COMMUNITY): Payer: Self-pay

## 2023-07-12 ENCOUNTER — Other Ambulatory Visit (HOSPITAL_COMMUNITY): Payer: Self-pay

## 2023-07-13 ENCOUNTER — Other Ambulatory Visit (HOSPITAL_COMMUNITY): Payer: Self-pay

## 2023-07-14 ENCOUNTER — Other Ambulatory Visit (HOSPITAL_COMMUNITY): Payer: Self-pay

## 2023-07-14 MED ORDER — AMPHETAMINE-DEXTROAMPHETAMINE 10 MG PO TABS
5.0000 mg | ORAL_TABLET | Freq: Every day | ORAL | 0 refills | Status: DC
  Filled 2023-07-14: qty 30, 30d supply, fill #0

## 2023-07-27 ENCOUNTER — Other Ambulatory Visit (HOSPITAL_COMMUNITY): Payer: Self-pay

## 2023-07-28 ENCOUNTER — Other Ambulatory Visit (HOSPITAL_COMMUNITY): Payer: Self-pay

## 2023-08-05 ENCOUNTER — Other Ambulatory Visit (HOSPITAL_COMMUNITY): Payer: Self-pay

## 2023-08-09 ENCOUNTER — Other Ambulatory Visit (HOSPITAL_COMMUNITY): Payer: Self-pay

## 2023-08-09 ENCOUNTER — Other Ambulatory Visit (HOSPITAL_BASED_OUTPATIENT_CLINIC_OR_DEPARTMENT_OTHER): Payer: Self-pay

## 2023-08-09 MED ORDER — FLUOXETINE HCL 10 MG PO CAPS
30.0000 mg | ORAL_CAPSULE | Freq: Every day | ORAL | 6 refills | Status: AC
  Filled 2023-08-09: qty 90, 30d supply, fill #0
  Filled 2023-09-14: qty 90, 30d supply, fill #1
  Filled 2023-10-13: qty 90, 30d supply, fill #2
  Filled 2023-11-18: qty 90, 30d supply, fill #3

## 2023-08-19 ENCOUNTER — Other Ambulatory Visit (HOSPITAL_COMMUNITY): Payer: Self-pay

## 2023-08-19 MED ORDER — AMPHETAMINE-DEXTROAMPHETAMINE 10 MG PO TABS
5.0000 mg | ORAL_TABLET | Freq: Every day | ORAL | 0 refills | Status: DC
  Filled 2023-08-19: qty 30, 30d supply, fill #0

## 2023-09-02 ENCOUNTER — Other Ambulatory Visit (HOSPITAL_COMMUNITY): Payer: Self-pay

## 2023-09-14 ENCOUNTER — Other Ambulatory Visit (HOSPITAL_COMMUNITY): Payer: Self-pay

## 2023-09-14 DIAGNOSIS — Z79899 Other long term (current) drug therapy: Secondary | ICD-10-CM | POA: Diagnosis not present

## 2023-09-14 DIAGNOSIS — F902 Attention-deficit hyperactivity disorder, combined type: Secondary | ICD-10-CM | POA: Diagnosis not present

## 2023-09-14 DIAGNOSIS — F419 Anxiety disorder, unspecified: Secondary | ICD-10-CM | POA: Diagnosis not present

## 2023-09-14 MED ORDER — AMPHETAMINE-DEXTROAMPHETAMINE 10 MG PO TABS
ORAL_TABLET | Freq: Every day | ORAL | 0 refills | Status: DC
  Filled 2023-09-19: qty 60, 30d supply, fill #0

## 2023-09-19 ENCOUNTER — Other Ambulatory Visit (HOSPITAL_COMMUNITY): Payer: Self-pay

## 2023-10-14 ENCOUNTER — Other Ambulatory Visit (HOSPITAL_COMMUNITY): Payer: Self-pay

## 2023-10-14 MED ORDER — AMPHETAMINE-DEXTROAMPHETAMINE 10 MG PO TABS
10.0000 mg | ORAL_TABLET | Freq: Every day | ORAL | 0 refills | Status: DC
  Filled 2023-10-14 – 2023-10-19 (×2): qty 60, 30d supply, fill #0

## 2023-10-14 MED ORDER — HYDROXYZINE PAMOATE 25 MG PO CAPS
25.0000 mg | ORAL_CAPSULE | Freq: Every day | ORAL | 4 refills | Status: DC
  Filled 2023-10-14: qty 60, 30d supply, fill #0
  Filled 2023-11-10: qty 60, 30d supply, fill #1
  Filled 2023-12-09: qty 60, 30d supply, fill #2
  Filled 2024-01-12: qty 60, 30d supply, fill #3
  Filled 2024-02-13: qty 60, 30d supply, fill #4

## 2023-10-19 ENCOUNTER — Other Ambulatory Visit (HOSPITAL_COMMUNITY): Payer: Self-pay

## 2023-11-10 ENCOUNTER — Telehealth: Admitting: Physician Assistant

## 2023-11-10 ENCOUNTER — Other Ambulatory Visit (HOSPITAL_COMMUNITY): Payer: Self-pay

## 2023-11-10 DIAGNOSIS — L301 Dyshidrosis [pompholyx]: Secondary | ICD-10-CM | POA: Diagnosis not present

## 2023-11-10 MED ORDER — TRIAMCINOLONE ACETONIDE 0.1 % EX CREA
1.0000 | TOPICAL_CREAM | Freq: Two times a day (BID) | CUTANEOUS | 0 refills | Status: AC | PRN
  Filled 2023-11-10: qty 80, 40d supply, fill #0

## 2023-11-10 NOTE — Progress Notes (Signed)

## 2023-11-18 ENCOUNTER — Other Ambulatory Visit (HOSPITAL_COMMUNITY): Payer: Self-pay

## 2023-11-18 MED ORDER — AMPHETAMINE-DEXTROAMPHETAMINE 10 MG PO TABS
10.0000 mg | ORAL_TABLET | Freq: Every day | ORAL | 0 refills | Status: DC
  Filled 2023-11-18: qty 60, 30d supply, fill #0

## 2023-12-08 ENCOUNTER — Encounter

## 2023-12-08 ENCOUNTER — Telehealth: Admitting: Physician Assistant

## 2023-12-08 DIAGNOSIS — B9689 Other specified bacterial agents as the cause of diseases classified elsewhere: Secondary | ICD-10-CM | POA: Diagnosis not present

## 2023-12-08 DIAGNOSIS — J208 Acute bronchitis due to other specified organisms: Secondary | ICD-10-CM

## 2023-12-08 MED ORDER — AZITHROMYCIN 250 MG PO TABS: ORAL_TABLET | ORAL | 0 refills | Status: AC

## 2023-12-08 MED ORDER — ALBUTEROL SULFATE HFA 108 (90 BASE) MCG/ACT IN AERS: 1.0000 | INHALATION_SPRAY | Freq: Four times a day (QID) | RESPIRATORY_TRACT | 0 refills | Status: AC | PRN

## 2023-12-08 NOTE — Progress Notes (Signed)
 E-Visit for Cough   We are sorry that you are not feeling well.  Here is how we plan to help!  Based on your presentation I believe you most likely have A cough due to bacteria.  When patients have a fever and a productive cough with a change in color or increased sputum production, we are concerned about bacterial bronchitis.  If left untreated it can progress to pneumonia.  If your symptoms do not improve with your treatment plan it is important that you contact your provider.   I have prescribed Azithromyin 250 mg: two tablets now and then one tablet daily for 4 additonal days    In addition you may use A non-prescription cough medication called Mucinex DM: take 2 tablets every 12 hours.  I have also prescribed: Albuterol  inhaler Use 1-2 puffs every 6 hours as needed for shortness of breath, chest tightness, and/or wheezing.  From your responses in the eVisit questionnaire you describe inflammation in the upper respiratory tract which is causing a significant cough.  This is commonly called Bronchitis and has four common causes:   Allergies Viral Infections Acid Reflux Bacterial Infection Allergies, viruses and acid reflux are treated by controlling symptoms or eliminating the cause. An example might be a cough caused by taking certain blood pressure medications. You stop the cough by changing the medication. Another example might be a cough caused by acid reflux. Controlling the reflux helps control the cough.  USE OF BRONCHODILATOR (RESCUE) INHALERS: There is a risk from using your bronchodilator too frequently.  The risk is that over-reliance on a medication which only relaxes the muscles surrounding the breathing tubes can reduce the effectiveness of medications prescribed to reduce swelling and congestion of the tubes themselves.  Although you feel brief relief from the bronchodilator inhaler, your asthma may actually be worsening with the tubes becoming more swollen and filled with  mucus.  This can delay other crucial treatments, such as oral steroid medications. If you need to use a bronchodilator inhaler daily, several times per day, you should discuss this with your provider.  There are probably better treatments that could be used to keep your asthma under control.     HOME CARE Only take medications as instructed by your medical team. Complete the entire course of an antibiotic. Drink plenty of fluids and get plenty of rest. Avoid close contacts especially the very young and the elderly Cover your mouth if you cough or cough into your sleeve. Always remember to wash your hands A steam or ultrasonic humidifier can help congestion.   GET HELP RIGHT AWAY IF: You develop worsening fever. You become short of breath You cough up blood. Your symptoms persist after you have completed your treatment plan MAKE SURE YOU  Understand these instructions. Will watch your condition. Will get help right away if you are not doing well or get worse.    Thank you for choosing an e-visit.  Your e-visit answers were reviewed by a board certified advanced clinical practitioner to complete your personal care plan. Depending upon the condition, your plan could have included both over the counter or prescription medications.  Please review your pharmacy choice. Make sure the pharmacy is open so you can pick up prescription now. If there is a problem, you may contact your provider through [employer] and have the prescription routed to another pharmacy.  Your safety is important to us . If you have drug allergies check your prescription carefully.   For the next  24 hours you can use MyChart to ask questions about today's visit, request a non-urgent call back, or ask for a work or school excuse. You will get an email in the next two days asking about your experience. I hope that your e-visit has been valuable and will speed your recovery.    I have spent 5 minutes in review of  e-visit questionnaire, review and updating patient chart, medical decision making and response to patient.   Delon CHRISTELLA Dickinson, PA-C

## 2023-12-21 ENCOUNTER — Other Ambulatory Visit (HOSPITAL_COMMUNITY): Payer: Self-pay

## 2023-12-21 ENCOUNTER — Other Ambulatory Visit: Payer: Self-pay

## 2023-12-21 DIAGNOSIS — F902 Attention-deficit hyperactivity disorder, combined type: Secondary | ICD-10-CM | POA: Diagnosis not present

## 2023-12-21 DIAGNOSIS — Z79899 Other long term (current) drug therapy: Secondary | ICD-10-CM | POA: Diagnosis not present

## 2023-12-21 MED ORDER — AMPHETAMINE-DEXTROAMPHETAMINE 10 MG PO TABS
10.0000 mg | ORAL_TABLET | Freq: Every day | ORAL | 0 refills | Status: DC
  Filled 2023-12-21: qty 60, 30d supply, fill #0

## 2023-12-21 MED ORDER — FLUOXETINE HCL 10 MG PO CAPS
30.0000 mg | ORAL_CAPSULE | Freq: Every day | ORAL | 3 refills | Status: AC
  Filled 2023-12-21: qty 270, 90d supply, fill #0
  Filled 2024-04-04: qty 270, 90d supply, fill #1

## 2023-12-21 MED ORDER — LISDEXAMFETAMINE DIMESYLATE 10 MG PO CAPS
10.0000 mg | ORAL_CAPSULE | Freq: Every day | ORAL | 0 refills | Status: DC
  Filled 2023-12-21: qty 30, 30d supply, fill #0

## 2024-01-09 ENCOUNTER — Other Ambulatory Visit (HOSPITAL_BASED_OUTPATIENT_CLINIC_OR_DEPARTMENT_OTHER): Payer: Self-pay

## 2024-01-09 MED ORDER — FLUZONE 0.5 ML IM SUSY
0.5000 mL | PREFILLED_SYRINGE | Freq: Once | INTRAMUSCULAR | 0 refills | Status: AC
  Filled 2024-01-09: qty 0.5, 1d supply, fill #0

## 2024-01-30 ENCOUNTER — Other Ambulatory Visit (HOSPITAL_COMMUNITY): Payer: Self-pay

## 2024-01-30 MED ORDER — AMPHETAMINE-DEXTROAMPHETAMINE 10 MG PO TABS
10.0000 mg | ORAL_TABLET | Freq: Every day | ORAL | 0 refills | Status: DC
  Filled 2024-01-30: qty 15, 8d supply, fill #0
  Filled 2024-01-30: qty 45, 22d supply, fill #0

## 2024-01-30 MED ORDER — LISDEXAMFETAMINE DIMESYLATE 10 MG PO CAPS
10.0000 mg | ORAL_CAPSULE | Freq: Every day | ORAL | 0 refills | Status: DC
  Filled 2024-01-30: qty 30, 30d supply, fill #0

## 2024-03-14 ENCOUNTER — Other Ambulatory Visit (HOSPITAL_COMMUNITY): Payer: Self-pay

## 2024-03-14 MED ORDER — AMPHETAMINE-DEXTROAMPHETAMINE 10 MG PO TABS
10.0000 mg | ORAL_TABLET | Freq: Every day | ORAL | 0 refills | Status: DC
  Filled 2024-03-14: qty 60, 30d supply, fill #0

## 2024-03-14 MED ORDER — HYDROXYZINE PAMOATE 25 MG PO CAPS
25.0000 mg | ORAL_CAPSULE | Freq: Every day | ORAL | 4 refills | Status: AC
  Filled 2024-03-14: qty 60, 30d supply, fill #0
  Filled 2024-04-12: qty 60, 30d supply, fill #1

## 2024-03-14 MED ORDER — LISDEXAMFETAMINE DIMESYLATE 10 MG PO CAPS
10.0000 mg | ORAL_CAPSULE | Freq: Every day | ORAL | 0 refills | Status: DC
  Filled 2024-03-14: qty 30, 30d supply, fill #0

## 2024-04-16 ENCOUNTER — Other Ambulatory Visit (HOSPITAL_COMMUNITY): Payer: Self-pay

## 2024-04-16 MED ORDER — AMPHETAMINE-DEXTROAMPHETAMINE 10 MG PO TABS
10.0000 mg | ORAL_TABLET | Freq: Every day | ORAL | 0 refills | Status: AC
  Filled 2024-04-16: qty 60, 30d supply, fill #0

## 2024-04-16 MED ORDER — LISDEXAMFETAMINE DIMESYLATE 10 MG PO CAPS
10.0000 mg | ORAL_CAPSULE | Freq: Every day | ORAL | 0 refills | Status: AC
  Filled 2024-04-16: qty 30, 30d supply, fill #0
